# Patient Record
Sex: Male | Born: 1965 | Hispanic: Yes | Smoking: Never smoker
Health system: Southern US, Community
[De-identification: ages and names within clinical notes are randomized; demographics above are authoritative.]

## PROBLEM LIST (undated history)

## (undated) DIAGNOSIS — R7303 Prediabetes: Secondary | ICD-10-CM

---

## 2006-09-23 ENCOUNTER — Emergency Department: Payer: Self-pay | Admitting: Unknown Physician Specialty

## 2006-09-23 IMAGING — CT CT STONE STUDY
1 of 2 series · 15 of 32 positions shown, 19 images · non-contrast
Comparison: none

REASON FOR EXAM: right flank pain
COMMENTS:

[Series 2: stone · axial · 0.69mm/px · z∈[-271,+116]mm · 15 of 146 slices shown, 19 images]
[im 11/146  soft-tissue]
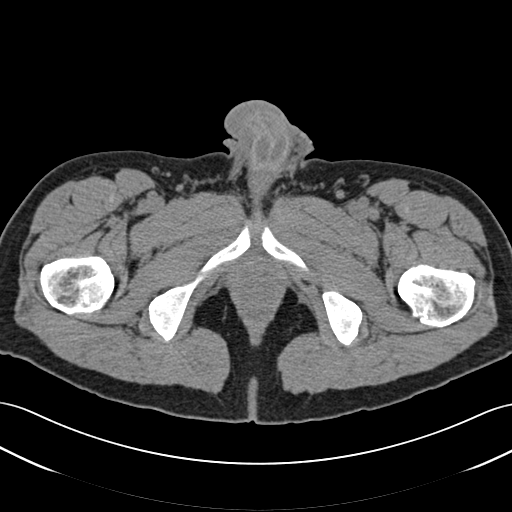
[im 11/146  bone]
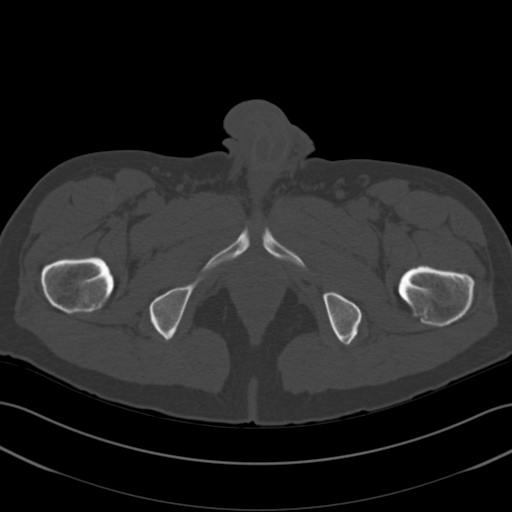
[im 21/146  soft-tissue]
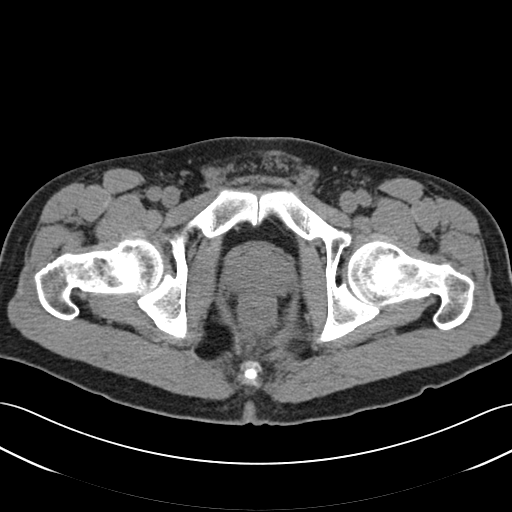
[im 32/146  soft-tissue]
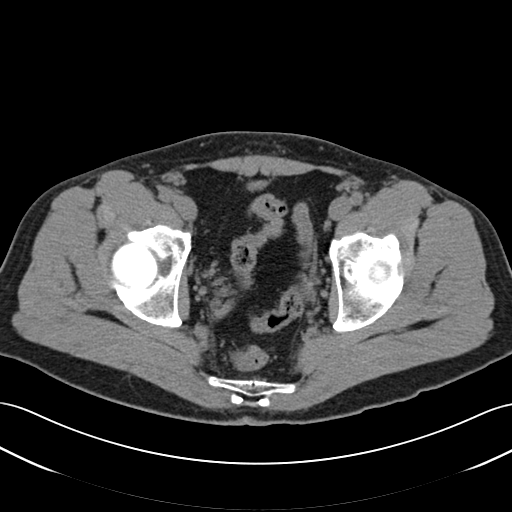
[im 42/146  soft-tissue]
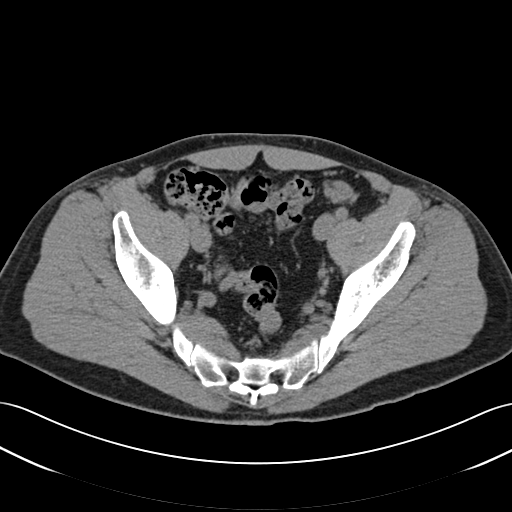
[im 52/146  soft-tissue]
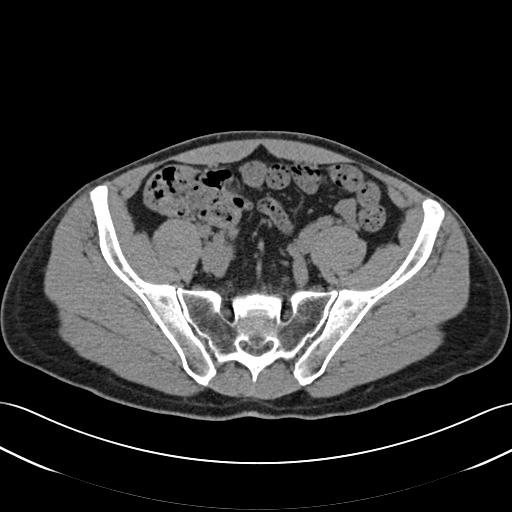
[im 63/146  soft-tissue]
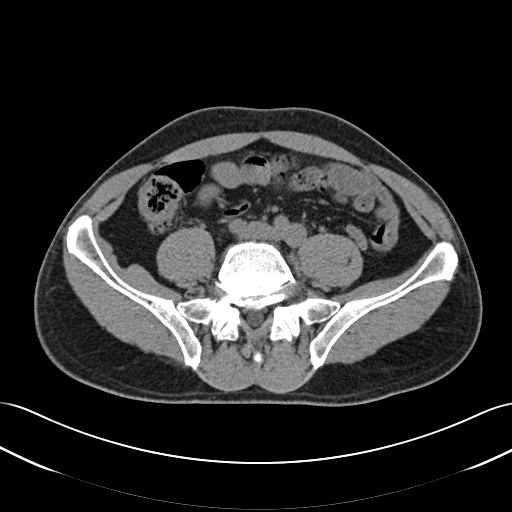
[im 73/146  soft-tissue]
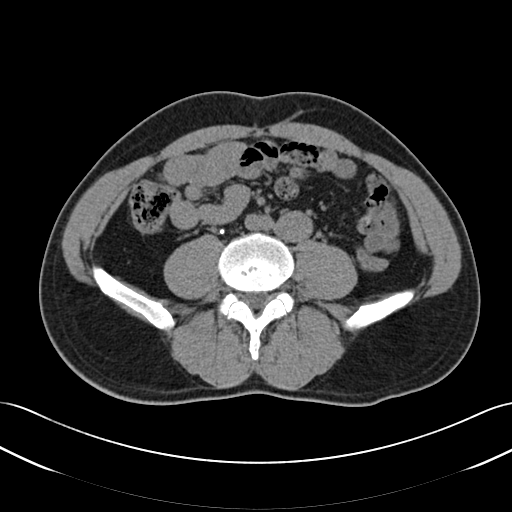
[im 83/146  soft-tissue]
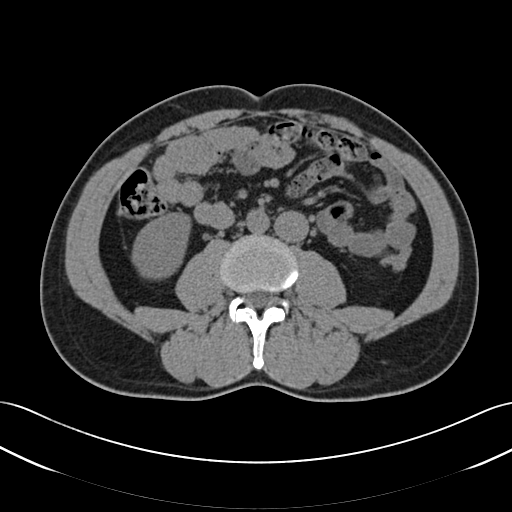
[im 94/146  soft-tissue]
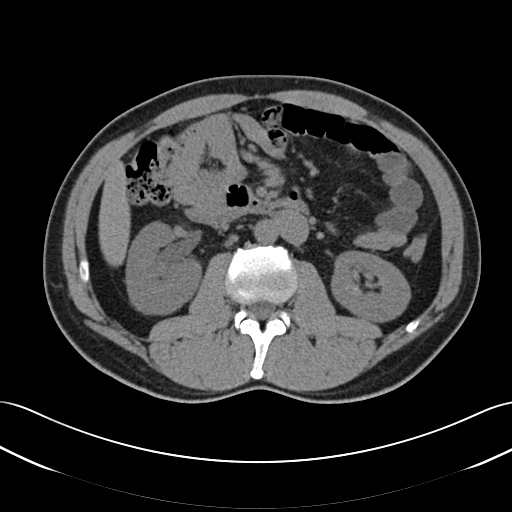
[im 94/146  bone]
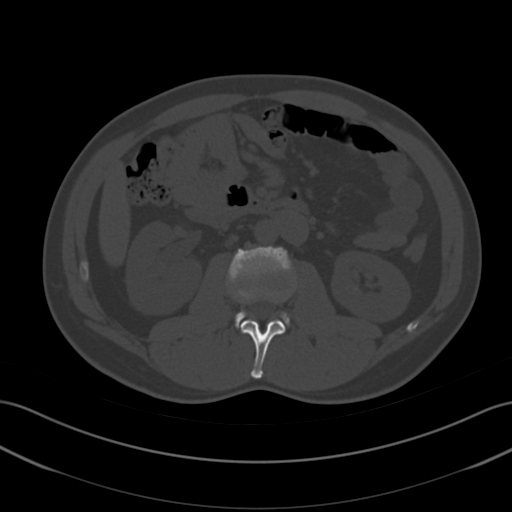
[im 104/146  soft-tissue]
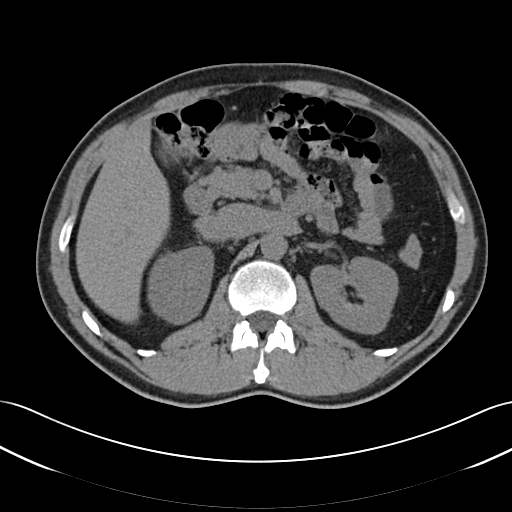
[im 114/146  soft-tissue]
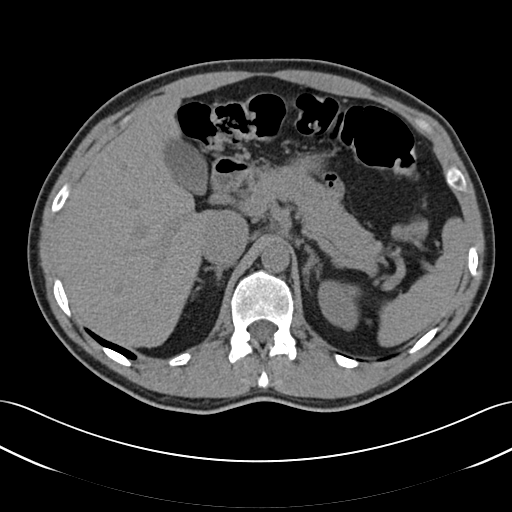
[im 125/146  soft-tissue]
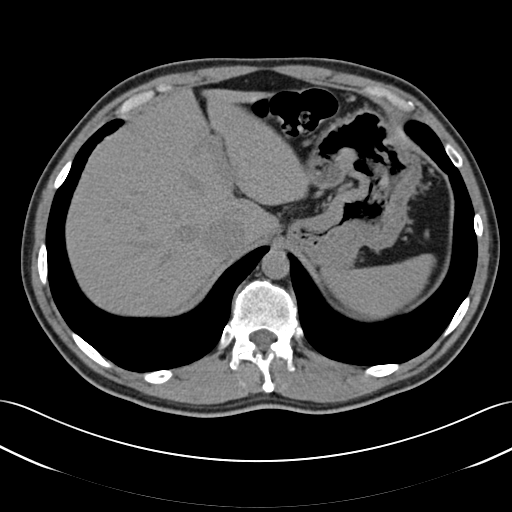
[im 125/146  lung]
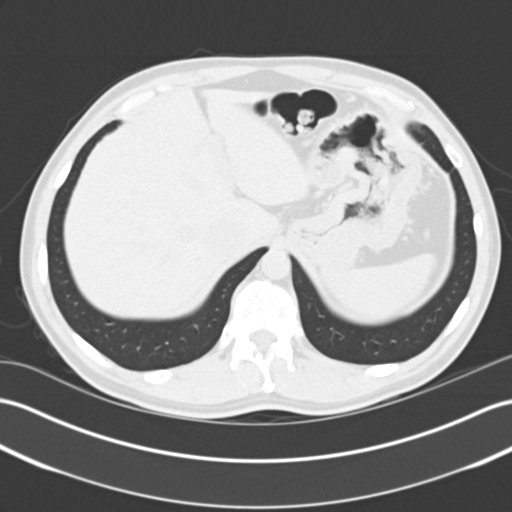
[im 130/146  lung]
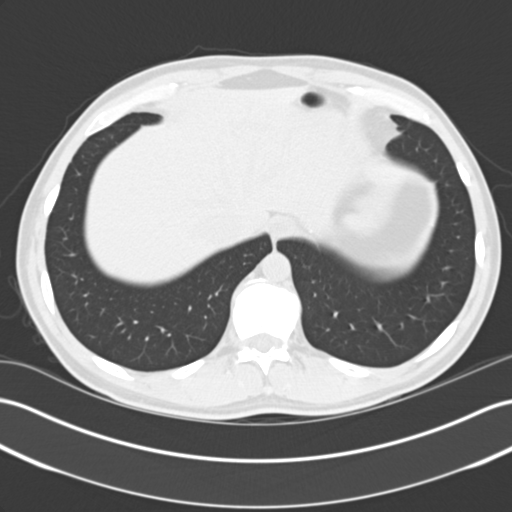
[im 135/146  soft-tissue]
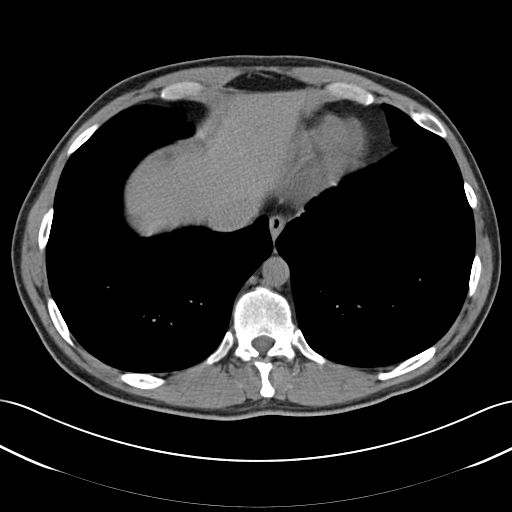
[im 135/146  lung]
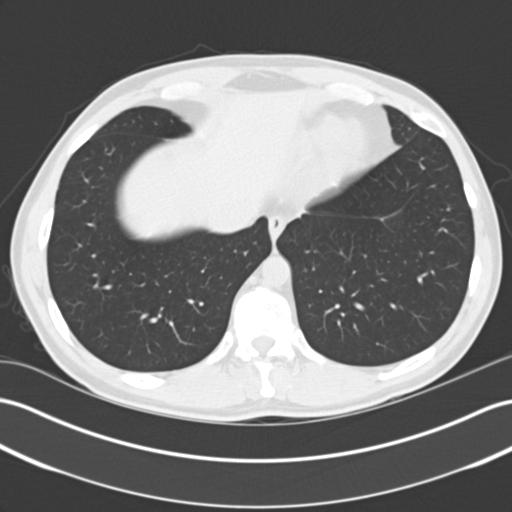
[im 140/146  lung]
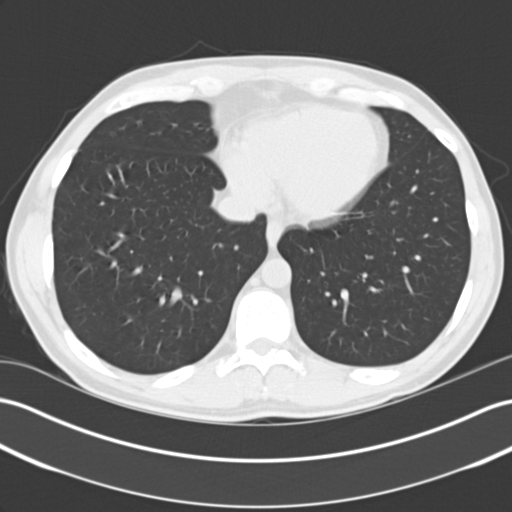

[15 of 32 positions shown; findings below may reference images not displayed]

PROCEDURE:     CT  - CT ABDOMEN /PELVIS WO (STONE)  - September 23, 2006  [DATE]

RESULT:          Noncontrast emergent CT scan of the abdomen and pelvis is
performed utilizing a renal stone protocol.

Images through the lung bases show normal aeration of the lungs.  The study
demonstrates mild RIGHT-sided hydronephrosis and hydroureter down to the
level of the iliac crest on image #74 where there is a 3 mm stone in the
proximal RIGHT ureter.  No other renal stones are seen.  No other ureteral
stones are evident.  There is no free air.  There is no free fluid.  Note is
made of an anatomical variant over the LEFT side of the inferior vena cava
below the level of the renal veins.  No acute inflammatory stranding is
seen.  Incidental note is made of a tiny calcified granuloma in the RIGHT
lower lobe on image #24.  No radiopaque gallstones are seen.  Otherwise, the
abdominal viscera appear to be grossly normal within the scope of a
noncontrast study.  Severe intervertebral joint space narrowing is seen at
L5-S1.  There may be minimal anterolisthesis of L5 on S1.  Degenerative
endplate signal changes are seen at the inferior aspect of L5 and the
superior aspect of S1.
IMPRESSION: 1.     A 3 mm RIGHT ureteral stent at the level of the iliac crest with mild
hydroureter and hydronephrosis on the RIGHT.
2.     Moderately severe degenerative changes at L5-S1.
3.     Tiny granuloma at the RIGHT lung base.

## 2014-03-13 ENCOUNTER — Ambulatory Visit: Payer: Self-pay | Admitting: Gastroenterology

## 2017-11-25 ENCOUNTER — Encounter: Payer: Self-pay | Admitting: Emergency Medicine

## 2017-11-25 ENCOUNTER — Other Ambulatory Visit: Payer: Self-pay

## 2017-11-25 ENCOUNTER — Emergency Department
Admission: EM | Admit: 2017-11-25 | Discharge: 2017-11-25 | Disposition: A | Discharge status: Home / Self Care | Payer: BLUE CROSS/BLUE SHIELD | Attending: Emergency Medicine | Admitting: Emergency Medicine

## 2017-11-25 ENCOUNTER — Emergency Department: Payer: BLUE CROSS/BLUE SHIELD

## 2017-11-25 DIAGNOSIS — E0590 Thyrotoxicosis, unspecified without thyrotoxic crisis or storm: Secondary | ICD-10-CM | POA: Diagnosis not present

## 2017-11-25 DIAGNOSIS — R002 Palpitations: Secondary | ICD-10-CM | POA: Insufficient documentation

## 2017-11-25 HISTORY — DX: Prediabetes: R73.03

## 2017-11-25 LAB — BASIC METABOLIC PANEL
ANION GAP: 10 (ref 5–15)
BUN: 22 mg/dL — AB (ref 6–20)
CHLORIDE: 103 mmol/L (ref 101–111)
CO2: 24 mmol/L (ref 22–32)
Calcium: 9.5 mg/dL (ref 8.9–10.3)
Creatinine, Ser: 0.86 mg/dL (ref 0.61–1.24)
GFR calc Af Amer: >60 mL/min (ref >60)
GLUCOSE: 152 mg/dL — AB (ref 65–99)
POTASSIUM: 3.5 mmol/L (ref 3.5–5.1)
Sodium: 137 mmol/L (ref 135–145)

## 2017-11-25 LAB — CBC
HEMATOCRIT: 44 % (ref 40.0–52.0)
HEMOGLOBIN: 14.6 g/dL (ref 13.0–18.0)
MCH: 27.6 pg (ref 26.0–34.0)
MCHC: 33.2 g/dL (ref 32.0–36.0)
MCV: 82.9 fL (ref 80.0–100.0)
Platelets: 225 10*3/uL (ref 150–440)
RBC: 5.31 MIL/uL (ref 4.40–5.90)
RDW: 15.4 % — AB (ref 11.5–14.5)
WBC: 6.9 10*3/uL (ref 3.8–10.6)

## 2017-11-25 LAB — TROPONIN I: Troponin I: <0.03 ng/mL (ref <0.03)

## 2017-11-25 LAB — T4, FREE: Free T4: 2.75 ng/dL — ABNORMAL HIGH (ref 0.61–1.12)

## 2017-11-25 LAB — TSH: TSH: <0.01 u[IU]/mL — ABNORMAL LOW (ref 0.350–4.500)

## 2017-11-25 IMAGING — CR DG CHEST 2V
2 series · 2 of 2 positions shown · non-contrast
Comparison: None.

CLINICAL DATA: Shortness of breath and palpitation today.

EXAM:
CHEST  2 VIEW

[chest pa]
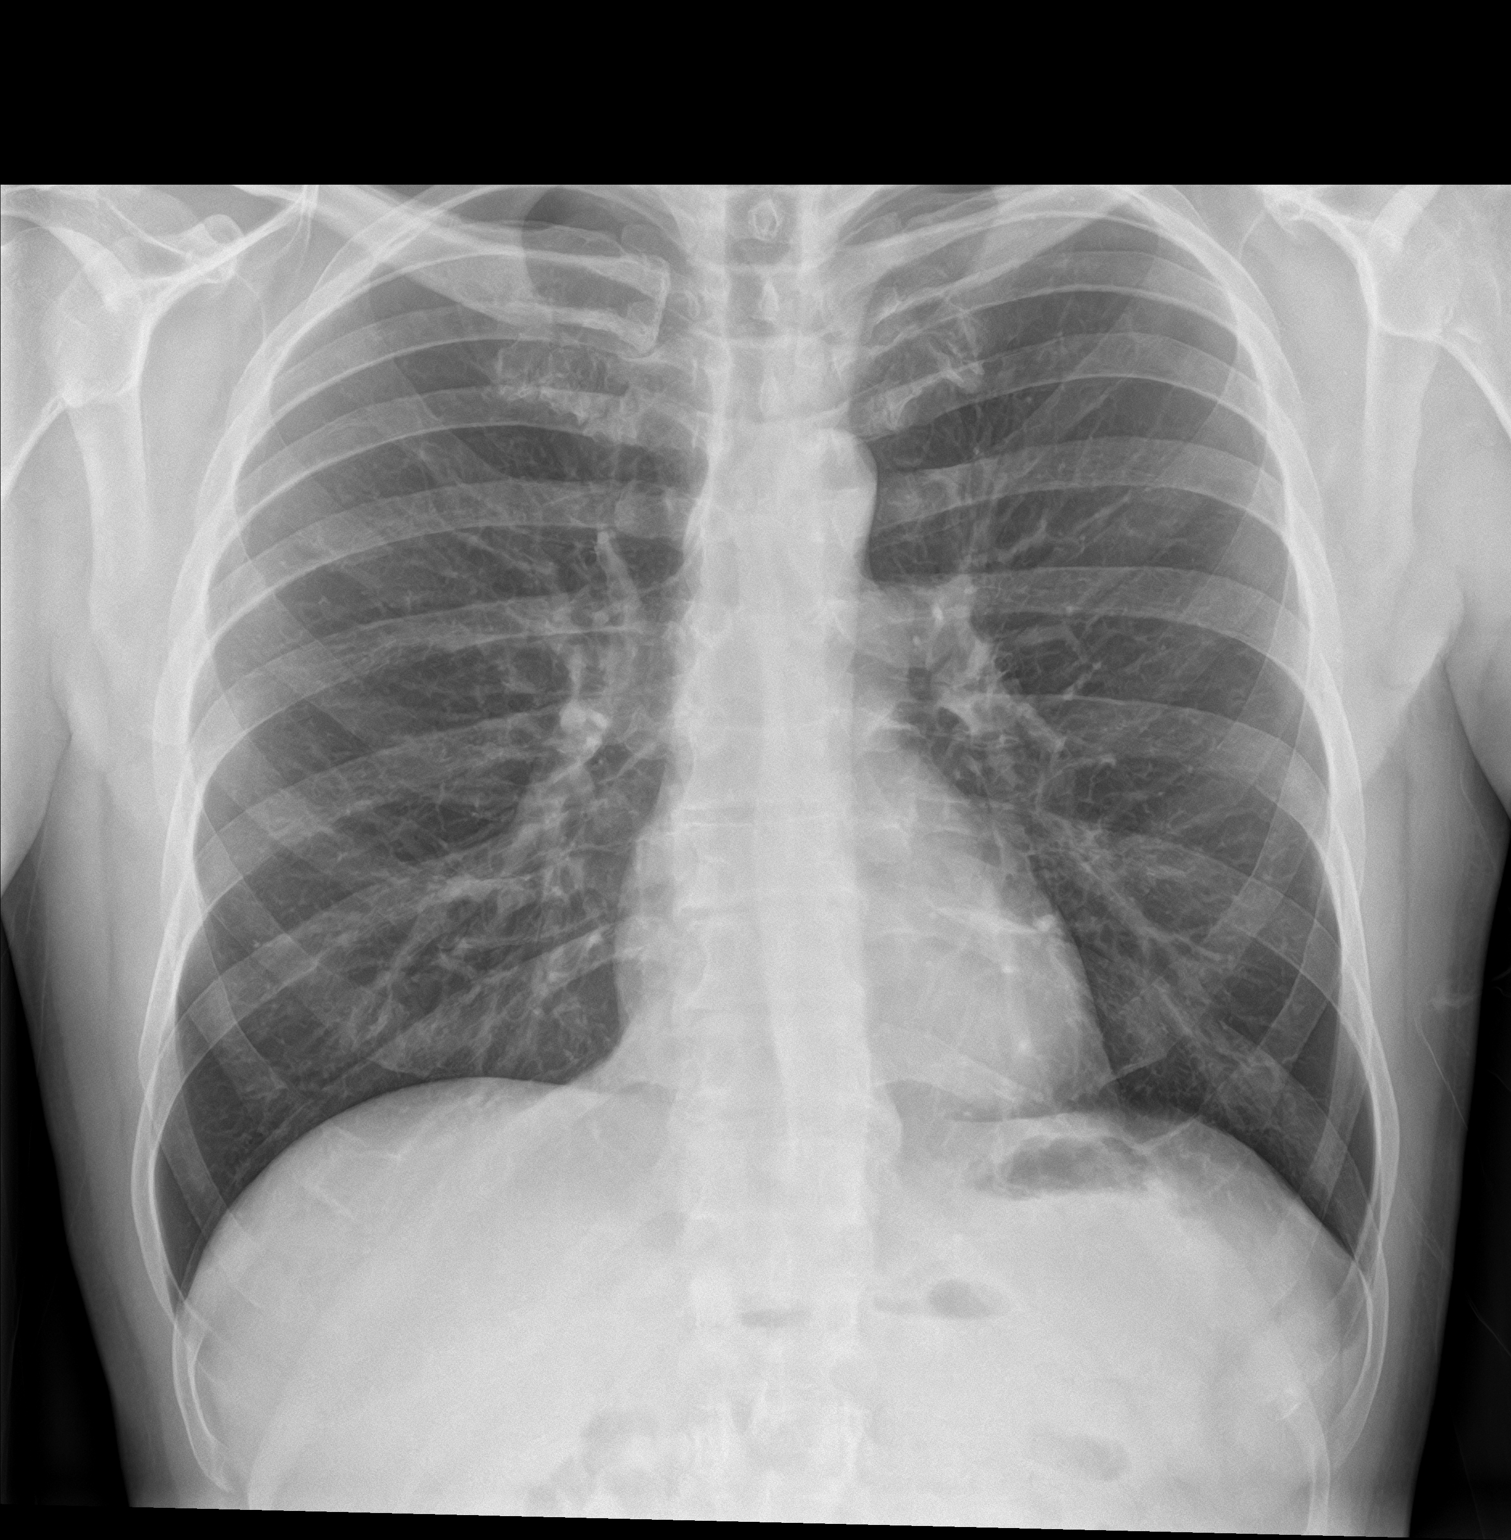

[chest lat]
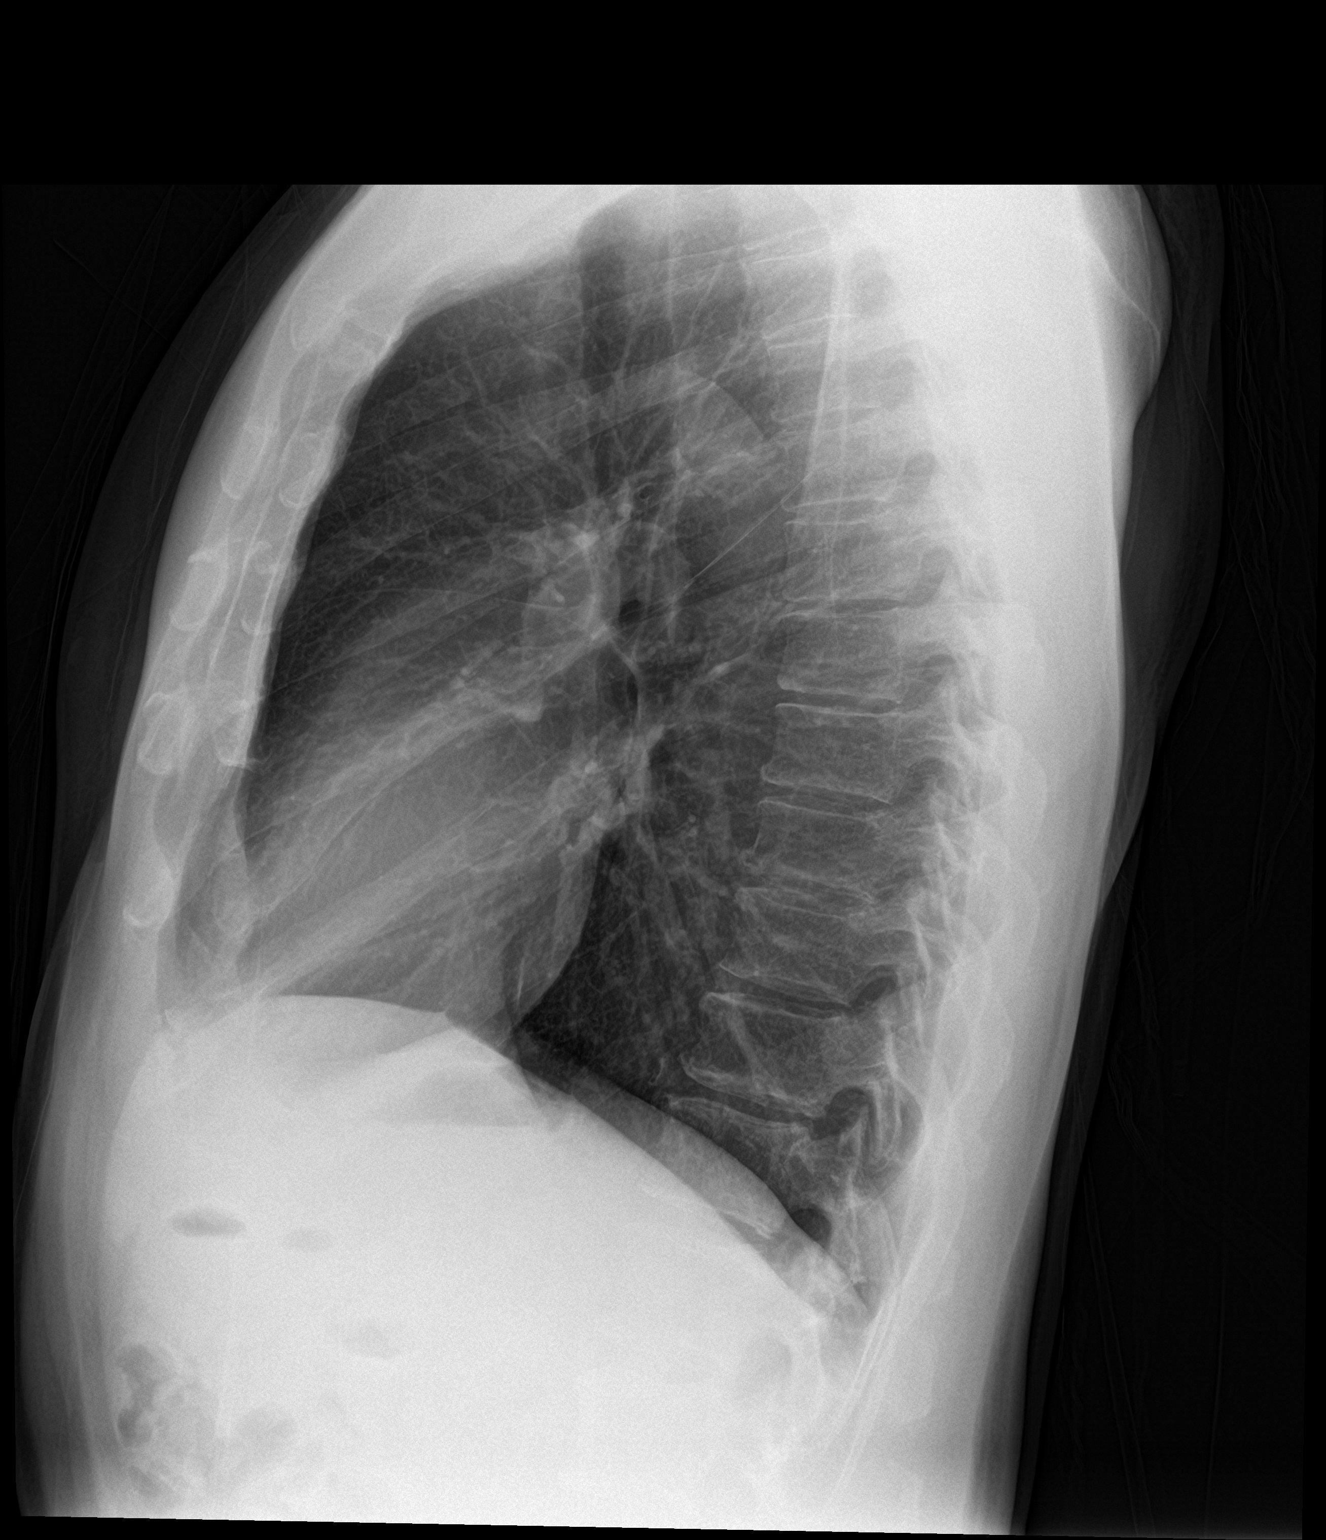

[2 of 2 positions shown; findings below may reference images not displayed]

FINDINGS: The heart size and mediastinal contours are within normal limits.
There is no focal infiltrate, pulmonary edema, or pleural effusion.
There is mild scoliosis of spine.
IMPRESSION: No active cardiopulmonary disease.

## 2017-11-25 MED ORDER — PROPRANOLOL HCL 20 MG PO TABS: 20.0000 mg | ORAL_TABLET | Freq: Two times a day (BID) | ORAL | 0 refills | Status: AC

## 2017-11-25 MED ORDER — METHIMAZOLE 10 MG PO TABS: 10.0000 mg | ORAL_TABLET | Freq: Every day | ORAL | 0 refills | Status: AC

## 2017-11-25 MED ORDER — PROPRANOLOL HCL 20 MG PO TABS
20.0000 mg | ORAL_TABLET | Freq: Once | ORAL | Status: AC
  Administered 2017-11-25: 20 mg via ORAL
  Filled 2017-11-25: qty 1

## 2017-11-25 MED ORDER — METHIMAZOLE 10 MG PO TABS
10.0000 mg | ORAL_TABLET | ORAL | Status: AC
  Administered 2017-11-25: 10 mg via ORAL
  Filled 2017-11-25: qty 1

## 2017-11-25 NOTE — ED Notes (Signed)
No distress noted, pt seated on side of bed, wife at bedside, cont to monitor

## 2017-11-25 NOTE — Discharge Instructions (Addendum)
Your labs show that you have an overactive thyroid gland which is causing your symptoms. Follow up with your primary care doctor for continued evaluation of this finding.  Take the methimazole and propranolol as prescribed to control your symptoms in the meantime.

## 2017-11-25 NOTE — ED Triage Notes (Signed)
Arrives c/o palpitations.  States feeling of heart racing started just PTA.  Patient is anxious.  No SOB/ DOE

## 2017-11-25 NOTE — ED Triage Notes (Signed)
Pt feels like heart has been "beating fast" last night.  Has had some SHOB but denies nausea, vomiting or SHOB.  Appears anxious.  When asked if has had similar in past states "when I get agitated".  Asked pt if stressed lately and he reports no but wife shakes head yes when he is not looking.  No pain or fevers.  No cough.

## 2017-11-25 NOTE — ED Provider Notes (Signed)
[facility] Emergency Department Provider Note  ____________________________________________  Time seen: Approximately 8:11 PM  I have reviewed the triage vital signs and the nursing notes.   HISTORY  Chief Complaint Palpitations    HPI Bradley Robinson is a 52 y.o. male who complains of episodes of his heart racing since last night. He is had episodes like this in the past whenever he did have exertion but since last night it seemed to happen with even minimal activity. Has palpitations and feeling like he cannot pass out. No chest pain or shortness of breath. No fevers chills sweats or weight changes. No hot or cold intolerance. No alleviating factors. Intermittent. No radiating pain. Rates it as severe.  No medical history. Takes no medicines.     Past Medical History:  Diagnosis Date  . Pre-diabetes      There are no active problems to display for this patient.    History reviewed. No pertinent surgical history.   Prior to Admission medications   Medication Sig Start Date End Date Taking? Authorizing Provider  methimazole (TAPAZOLE) 10 MG tablet Take 1 tablet (10 mg total) by mouth daily. 11/25/17   Sharman CheekStafford, Achille Xiang, MD  propranolol (INDERAL) 20 MG tablet Take 1 tablet (20 mg total) by mouth 2 (two) times daily. 11/25/17   Sharman CheekStafford, Danni Shima, MD     Allergies Patient has no known allergies.   History reviewed. No pertinent family history.  Social History Social History   Tobacco Use  . Smoking status: Never Smoker  . Smokeless tobacco: Never Used  Substance Use Topics  . Alcohol use: No    Frequency: Never  . Drug use: No    Review of Systems  Constitutional:   No fever or chills.  ENT:   No sore throat. No rhinorrhea. Cardiovascular:  Positive palpitations without syncope. Respiratory:   No dyspnea or cough. Gastrointestinal:   Negative for abdominal pain, vomiting and diarrhea.  Musculoskeletal:   Negative for focal  pain or swelling All other systems reviewed and are negative except as documented above in ROS and HPI.  ____________________________________________   PHYSICAL EXAM:  VITAL SIGNS: ED Triage Vitals  Enc Vitals Group     BP 11/25/17 1607 (!) 171/77     Pulse Rate 11/25/17 1607 (!) 115     Resp 11/25/17 1607 20     Temp 11/25/17 1607 (!) 97.5 F (36.4 C)     Temp Source 11/25/17 1607 Oral     SpO2 11/25/17 1607 100 %     Weight 11/25/17 1612 170 lb (77.1 kg)     Height 11/25/17 1612 5\' 11"  (1.803 m)     Head Circumference --      Peak Flow --      Pain Score --      Pain Loc --      Pain Edu? --      Excl. in GC? --     Vital signs reviewed, nursing assessments reviewed.   Constitutional:   Alert and oriented. Well appearing and in no distress. Eyes:   No scleral icterus.  EOMI. No nystagmus. No conjunctival pallor. PERRL. ENT   Head:   Normocephalic and atraumatic.   Nose:   No congestion/rhinnorhea.    Mouth/Throat:   MMM, no pharyngeal erythema. No peritonsillar mass.    Neck:   No meningismus. Full ROM. Thyroid nonpalpable Hematological/Lymphatic/Immunilogical:   No cervical lymphadenopathy. Cardiovascular:   RRR, rate of 80. Symmetric bilateral radial and DP  pulses.  No murmurs.  Respiratory:   Normal respiratory effort without tachypnea/retractions. Breath sounds are clear and equal bilaterally. No wheezes/rales/rhonchi. Gastrointestinal:   Soft and nontender. Non distended. There is no CVA tenderness.  No rebound, rigidity, or guarding. Genitourinary:   deferred Musculoskeletal:   Normal range of motion in all extremities. No joint effusions.  No lower extremity tenderness.  No edema. Neurologic:   Normal speech and language.  Motor grossly intact. No gross focal neurologic deficits are appreciated.  Skin:    Skin is warm, dry and intact. No rash noted.  No petechiae, purpura, or bullae.  ____________________________________________    LABS  (pertinent positives/negatives) (all labs ordered are listed, but only abnormal results are displayed) Labs Reviewed  BASIC METABOLIC PANEL - Abnormal; Notable for the following components:      Result Value   Glucose, Bld 152 (*)    BUN 22 (*)    All other components within normal limits  CBC - Abnormal; Notable for the following components:   RDW 15.4 (*)    All other components within normal limits  TSH - Abnormal; Notable for the following components:   TSH <0.010 (*)    All other components within normal limits  T4, FREE - Abnormal; Notable for the following components:   Free T4 2.75 (*)    All other components within normal limits  TROPONIN I  TROPONIN I  TROPONIN I   ____________________________________________   EKG  Interpreted by me Sinus tachycardia rate 106, normal axis and intervals. Incomplete right bundle-branch block. No acute ischemic changes. Normal ST segments and T waves.  ____________________________________________    RADIOLOGY  Dg Chest 2 View  Result Date: 11/25/2017 CLINICAL DATA:  Shortness of breath and palpitation today. EXAM: CHEST  2 VIEW COMPARISON:  None. FINDINGS: The heart size and mediastinal contours are within normal limits. There is no focal infiltrate, pulmonary edema, or pleural effusion. There is mild scoliosis of spine. IMPRESSION: No active cardiopulmonary disease. Electronically Signed   By: Sherian Rein M.D.   On: 11/25/2017 17:22    ____________________________________________   PROCEDURES Procedures  ____________________________________________    CLINICAL IMPRESSION / ASSESSMENT AND PLAN / ED COURSE  Pertinent labs & imaging results that were available during my care of the patient were reviewed by me and considered in my medical decision making (see chart for details).     Clinical Course as of Nov 25 2009  Wynelle Link Nov 25, 2017  1829 Patient well appearing no acute distress, heart rate about 80 on my exam.  Initial labs unremarkable. We'll check thyroid studies, repeat troponin. Plan to start low-dose beta blocker for symptomatic relief and have him follow up with cardiology for potential Holter monitoring if there are no severe findings today. Overall exam is reassuring and I think he'll be suitable for outpatient follow-up with reassuring labs today.  [PS]  1844 Thyroid studies indicate primary hyperthyroidism. Will start beta blocker and refer to outpatient followup for further workup and management.  [PS]    Clinical Course User Index [PS] Sharman Cheek, MD     ____________________________________________   FINAL CLINICAL IMPRESSION(S) / ED DIAGNOSES    Final diagnoses:  Palpitations  Hyperthyroidism       Portions of this note were generated with dragon dictation software. Dictation errors may occur despite best attempts at proofreading.    Sharman Cheek, MD 11/25/17 2016

## 2017-11-25 NOTE — ED Notes (Signed)
Pt back from x-ray.

## 2017-11-25 NOTE — ED Notes (Signed)
Patient transported to X-ray 

## 2017-11-25 NOTE — ED Notes (Signed)

## 2017-11-29 ENCOUNTER — Other Ambulatory Visit: Payer: Self-pay | Admitting: Family Medicine

## 2017-11-29 DIAGNOSIS — E0590 Thyrotoxicosis, unspecified without thyrotoxic crisis or storm: Secondary | ICD-10-CM

## 2017-12-20 ENCOUNTER — Encounter: Admission: RE | Admit: 2017-12-20 | Payer: BLUE CROSS/BLUE SHIELD | Source: Ambulatory Visit

## 2017-12-20 ENCOUNTER — Encounter
Admission: RE | Admit: 2017-12-20 | Discharge: 2017-12-20 | Disposition: A | Discharge status: Home / Self Care | Payer: BLUE CROSS/BLUE SHIELD | Source: Ambulatory Visit | Attending: Family Medicine | Admitting: Family Medicine

## 2017-12-20 ENCOUNTER — Ambulatory Visit
Admission: RE | Admit: 2017-12-20 | Discharge: 2017-12-20 | Disposition: A | Discharge status: Home / Self Care | Payer: BLUE CROSS/BLUE SHIELD | Source: Ambulatory Visit | Attending: Family Medicine | Admitting: Family Medicine

## 2017-12-20 DIAGNOSIS — E0590 Thyrotoxicosis, unspecified without thyrotoxic crisis or storm: Secondary | ICD-10-CM

## 2017-12-20 IMAGING — US US THYROID
1 series · 14 of 25 positions shown · non-contrast
Comparison: None.

CLINICAL DATA: Hypothyroidism.

EXAM:
THYROID ULTRASOUND
TECHNIQUE: Ultrasound examination of the thyroid gland and adjacent soft
tissues was performed.

[Series 1: us thyroid · 0.06mm/px · 14 of 54 slices shown]
[im 1/54]
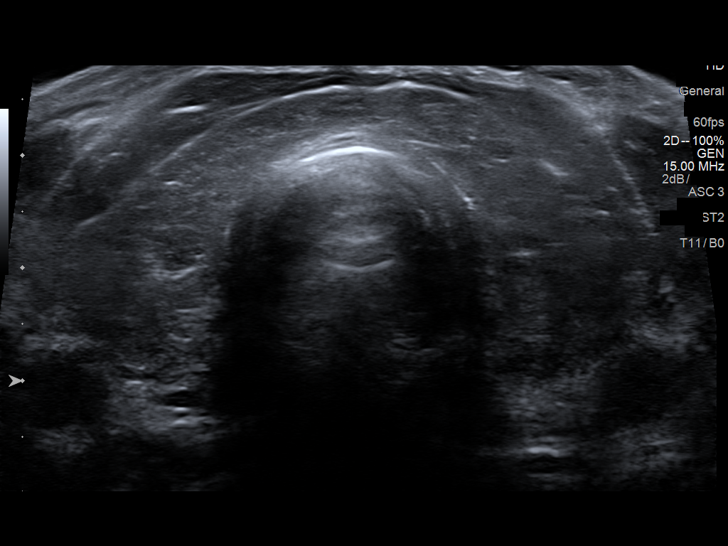
[im 5/54]
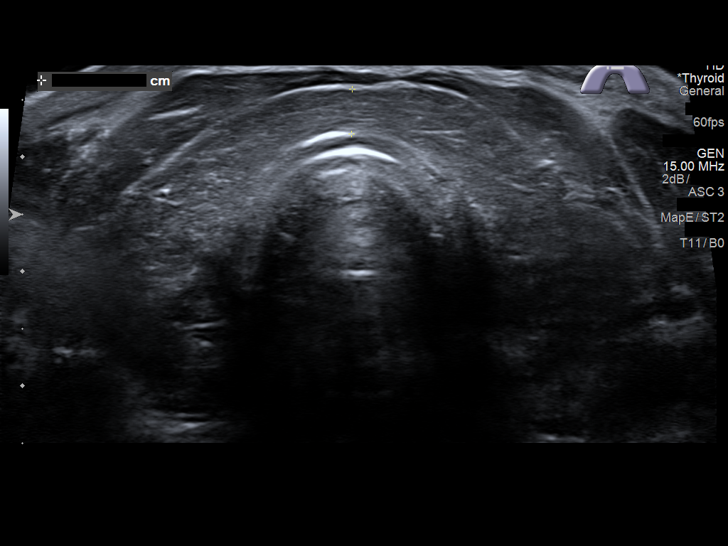
[im 9/54]
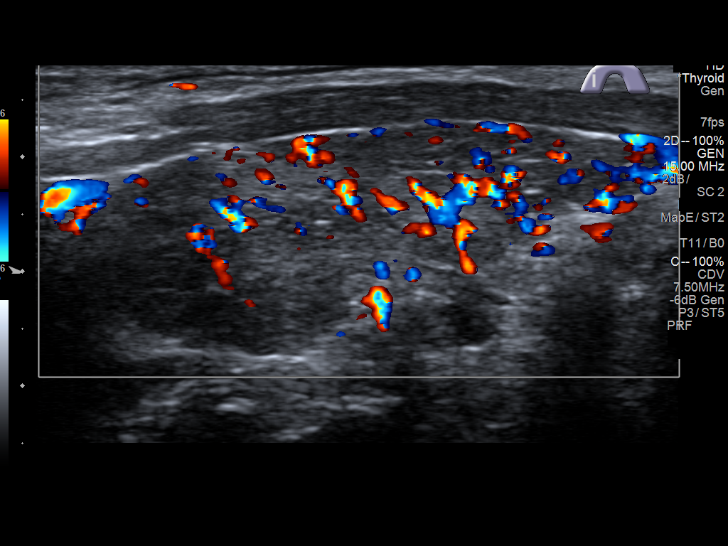
[im 14/54]
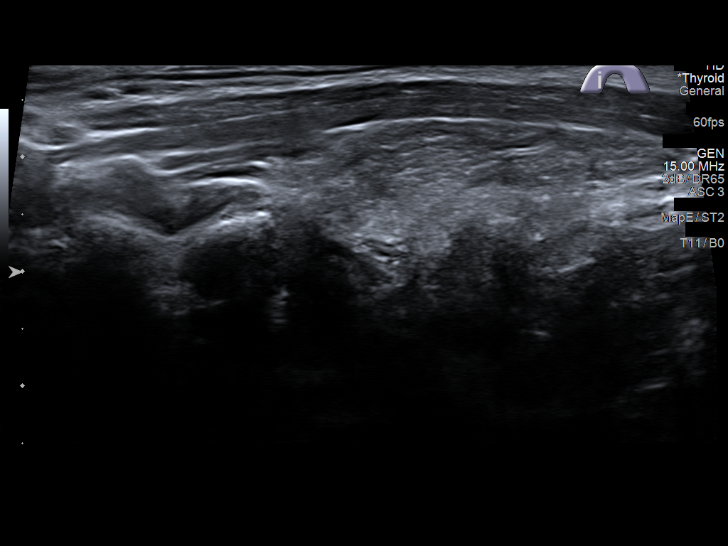
[im 18/54]
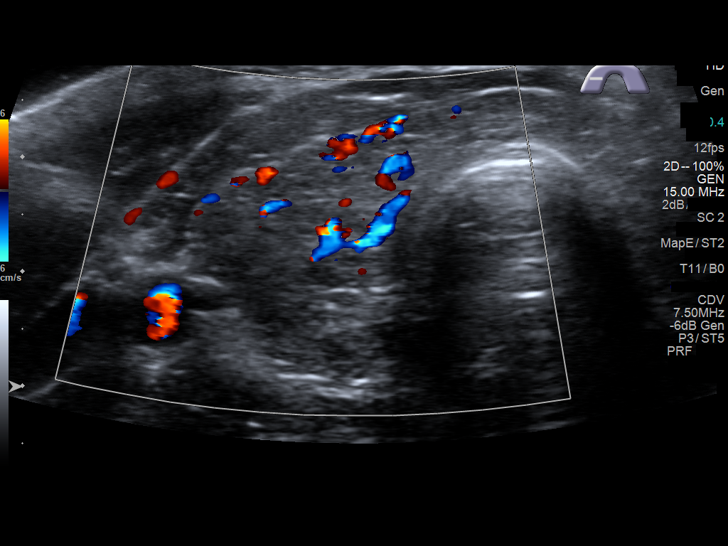
[im 20/54]
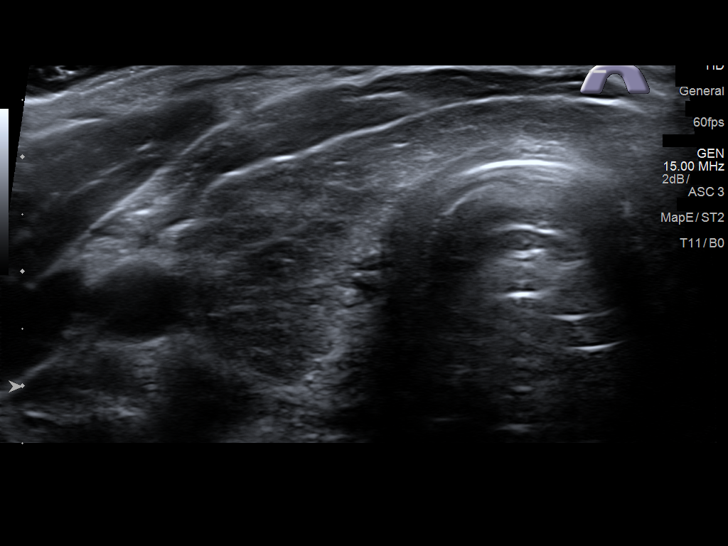
[im 25/54]
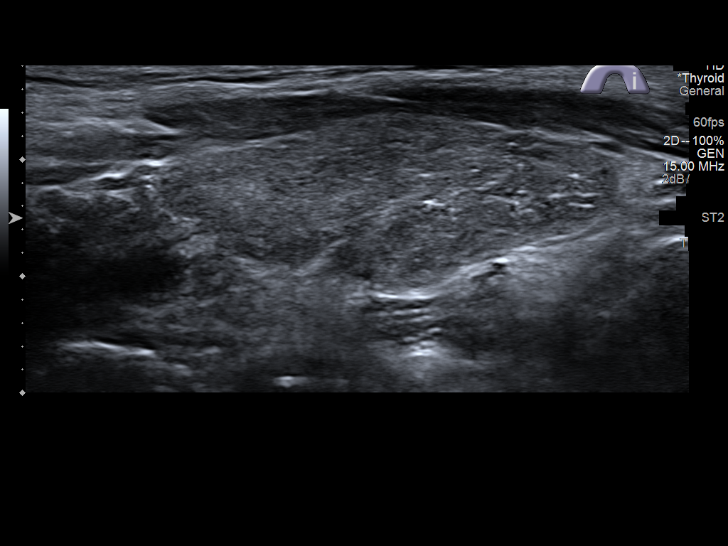
[im 29/54]
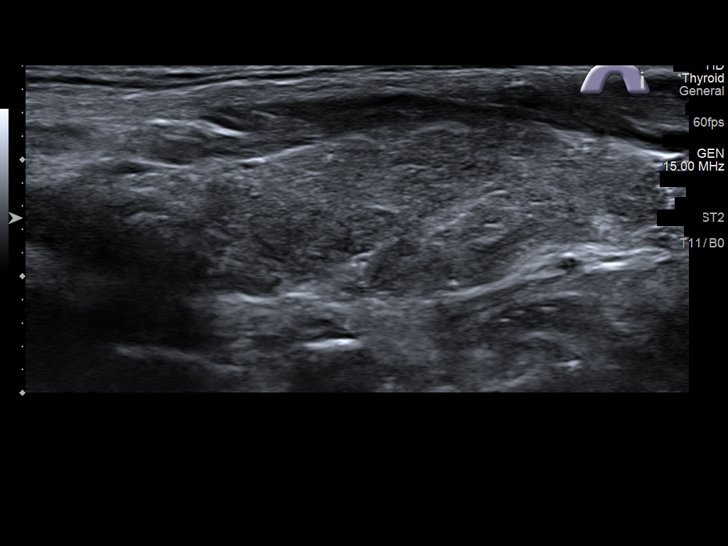
[im 34/54]
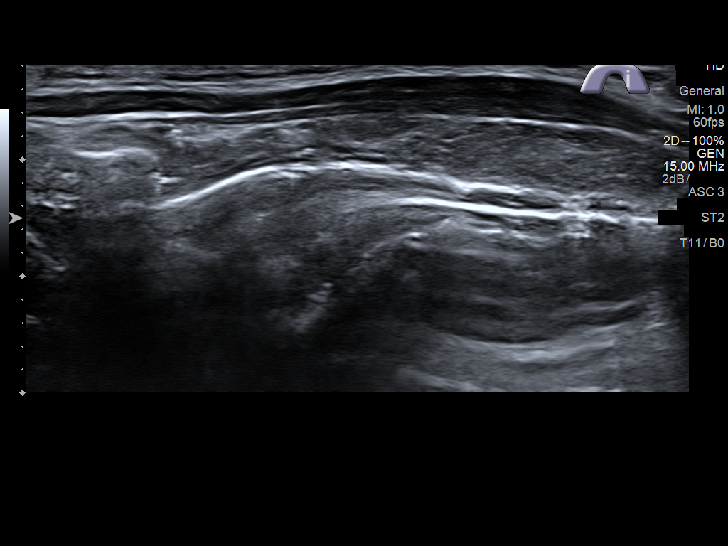
[im 36/54]
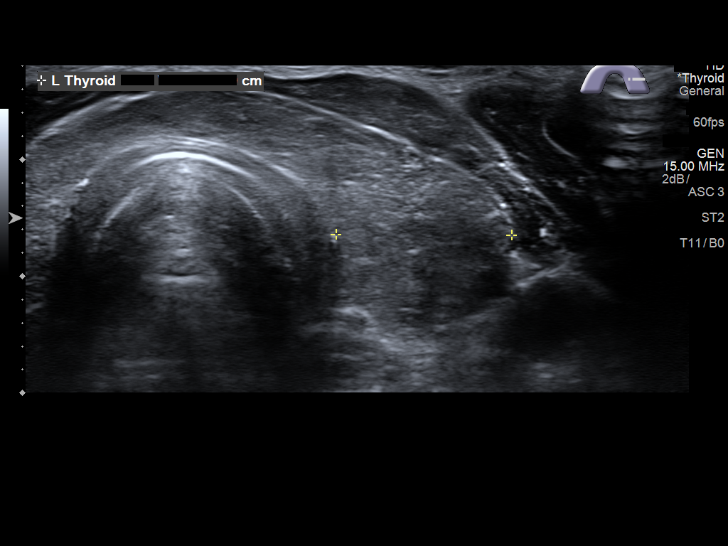
[im 40/54]
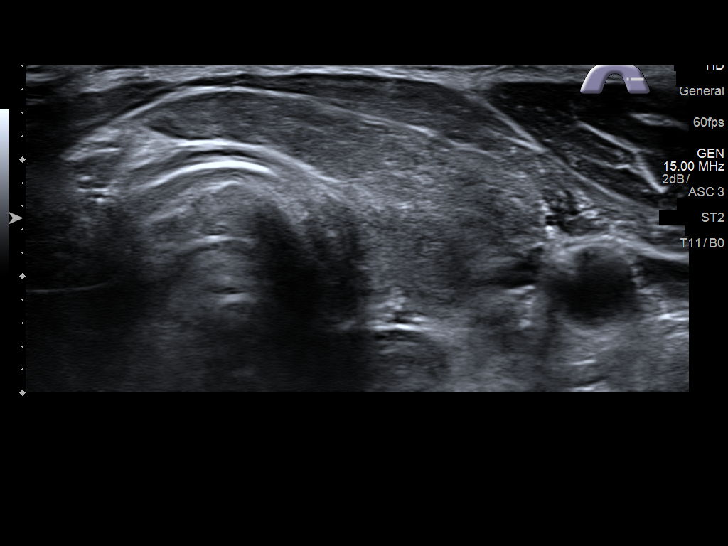
[im 45/54]
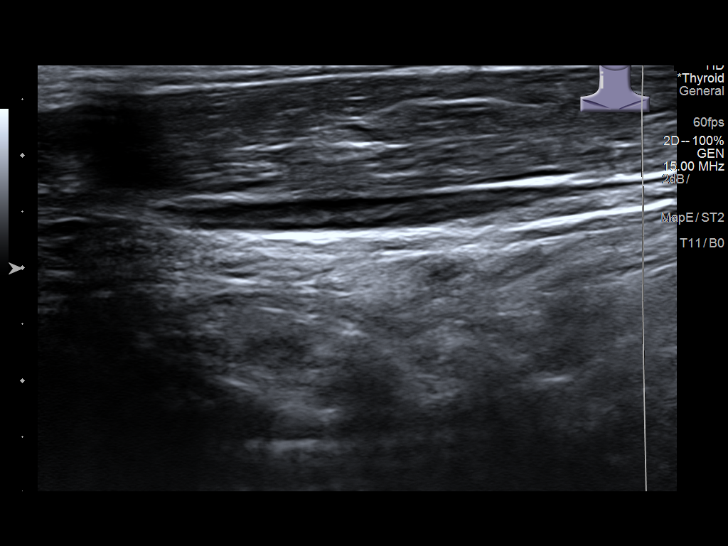
[im 49/54]
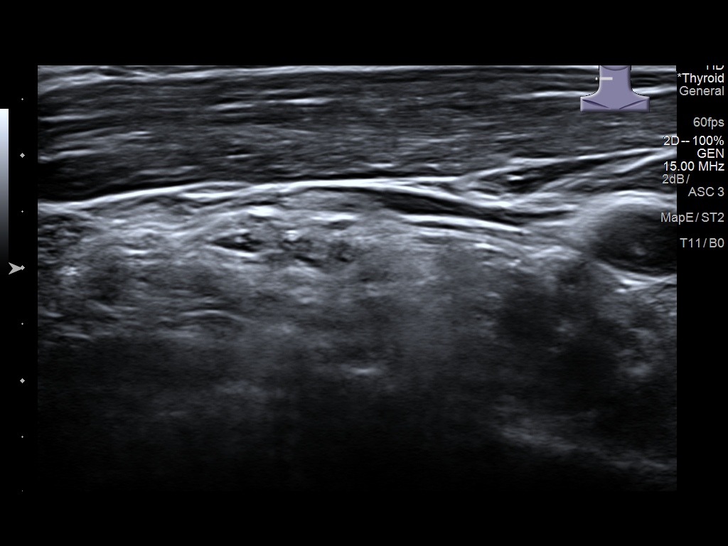
[im 54/54]
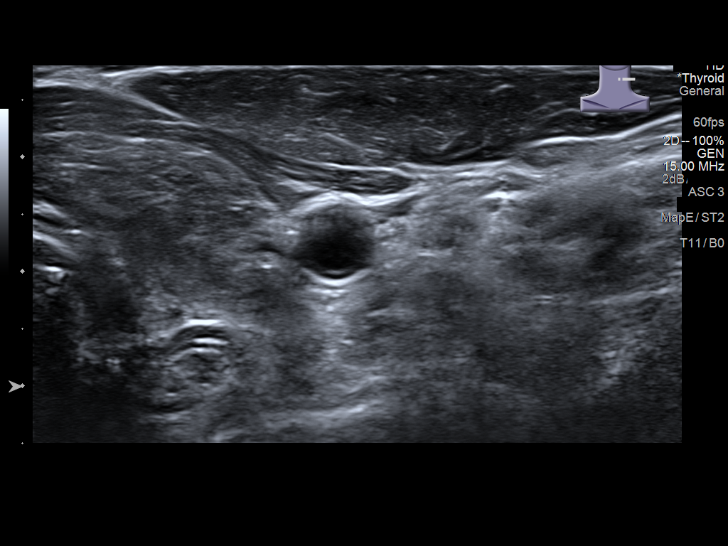

[14 of 25 positions shown; findings below may reference images not displayed]

FINDINGS: Parenchymal Echotexture: Moderately heterogenous

Isthmus: 0.4 cm

Right lobe: 5.3 x 1.8 x 1.7 cm

Left lobe: 4.0 x 1.5 x 1.5 cm

_________________________________________________________

Estimated total number of nodules >/= 1 cm: 0

Number of spongiform nodules >/=  2 cm not described below (TR1): 0

Number of mixed cystic and solid nodules >/= 1.5 cm not described
below (TR2): 0

_________________________________________________________

No discrete nodules are seen within the thyroid gland. Thyroid
tissue is slightly hypervascular.
IMPRESSION: Thyroid tissue is heterogeneous but no discrete nodules.

## 2017-12-21 ENCOUNTER — Ambulatory Visit: Payer: BLUE CROSS/BLUE SHIELD

## 2017-12-24 ENCOUNTER — Encounter: Payer: Self-pay | Admitting: Medical Oncology

## 2017-12-24 ENCOUNTER — Emergency Department
Admission: EM | Admit: 2017-12-24 | Discharge: 2017-12-24 | Disposition: A | Discharge status: Home / Self Care | Payer: BLUE CROSS/BLUE SHIELD | Attending: Emergency Medicine | Admitting: Emergency Medicine

## 2017-12-24 ENCOUNTER — Emergency Department: Payer: BLUE CROSS/BLUE SHIELD

## 2017-12-24 DIAGNOSIS — R000 Tachycardia, unspecified: Secondary | ICD-10-CM | POA: Diagnosis present

## 2017-12-24 DIAGNOSIS — Z79899 Other long term (current) drug therapy: Secondary | ICD-10-CM | POA: Diagnosis not present

## 2017-12-24 DIAGNOSIS — E0590 Thyrotoxicosis, unspecified without thyrotoxic crisis or storm: Secondary | ICD-10-CM | POA: Diagnosis not present

## 2017-12-24 DIAGNOSIS — R0981 Nasal congestion: Secondary | ICD-10-CM | POA: Diagnosis not present

## 2017-12-24 LAB — BASIC METABOLIC PANEL
Anion gap: 10 (ref 5–15)
BUN: 17 mg/dL (ref 6–20)
CHLORIDE: 106 mmol/L (ref 101–111)
CO2: 20 mmol/L — ABNORMAL LOW (ref 22–32)
Calcium: 9.6 mg/dL (ref 8.9–10.3)
Creatinine, Ser: 0.76 mg/dL (ref 0.61–1.24)
GFR calc non Af Amer: >60 mL/min (ref >60)
GLUCOSE: 140 mg/dL — AB (ref 65–99)
POTASSIUM: 3.6 mmol/L (ref 3.5–5.1)
Sodium: 136 mmol/L (ref 135–145)

## 2017-12-24 LAB — CBC
HEMATOCRIT: 45.9 % (ref 40.0–52.0)
Hemoglobin: 15.4 g/dL (ref 13.0–18.0)
MCH: 27.8 pg (ref 26.0–34.0)
MCHC: 33.5 g/dL (ref 32.0–36.0)
MCV: 83 fL (ref 80.0–100.0)
Platelets: 209 10*3/uL (ref 150–440)
RBC: 5.53 MIL/uL (ref 4.40–5.90)
RDW: 15.9 % — ABNORMAL HIGH (ref 11.5–14.5)
WBC: 9.8 10*3/uL (ref 3.8–10.6)

## 2017-12-24 LAB — TROPONIN I: Troponin I: <0.03 ng/mL (ref <0.03)

## 2017-12-24 LAB — T4, FREE: FREE T4: 2.75 ng/dL — AB (ref 0.61–1.12)

## 2017-12-24 LAB — TSH: TSH: <0.01 u[IU]/mL — ABNORMAL LOW (ref 0.350–4.500)

## 2017-12-24 IMAGING — CR DG CHEST 2V
1 series · 2 of 2 positions shown · non-contrast
Comparison: Chest x-ray 11/25/2017.

CLINICAL DATA: 51-year-old male with history of racing heart beat.
Shortness of breath.

EXAM:
CHEST  2 VIEW

[Series 1: dg chest 2 view · 0.14mm/px · 2 of 2 slices shown]
[im 1/2]
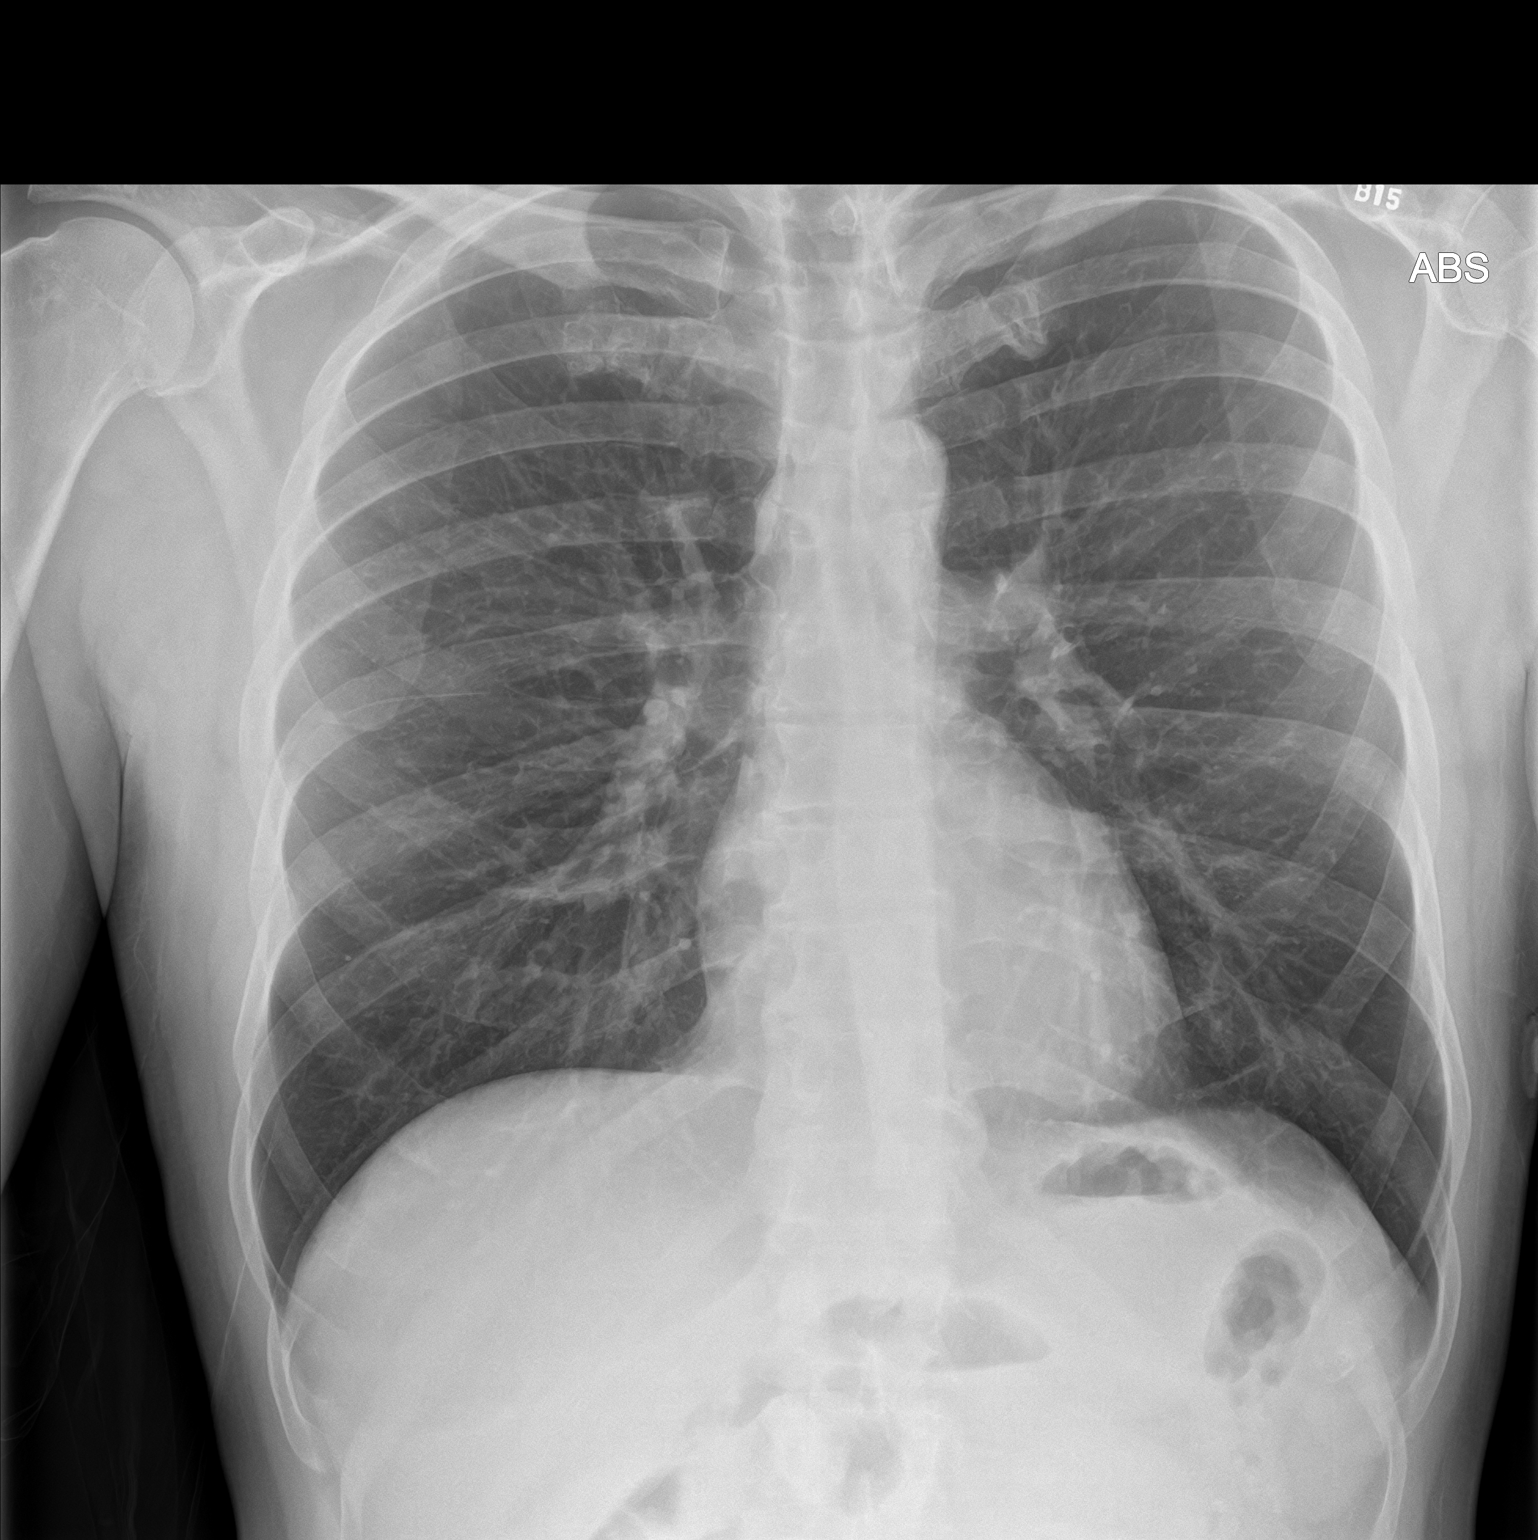
[im 2/2]
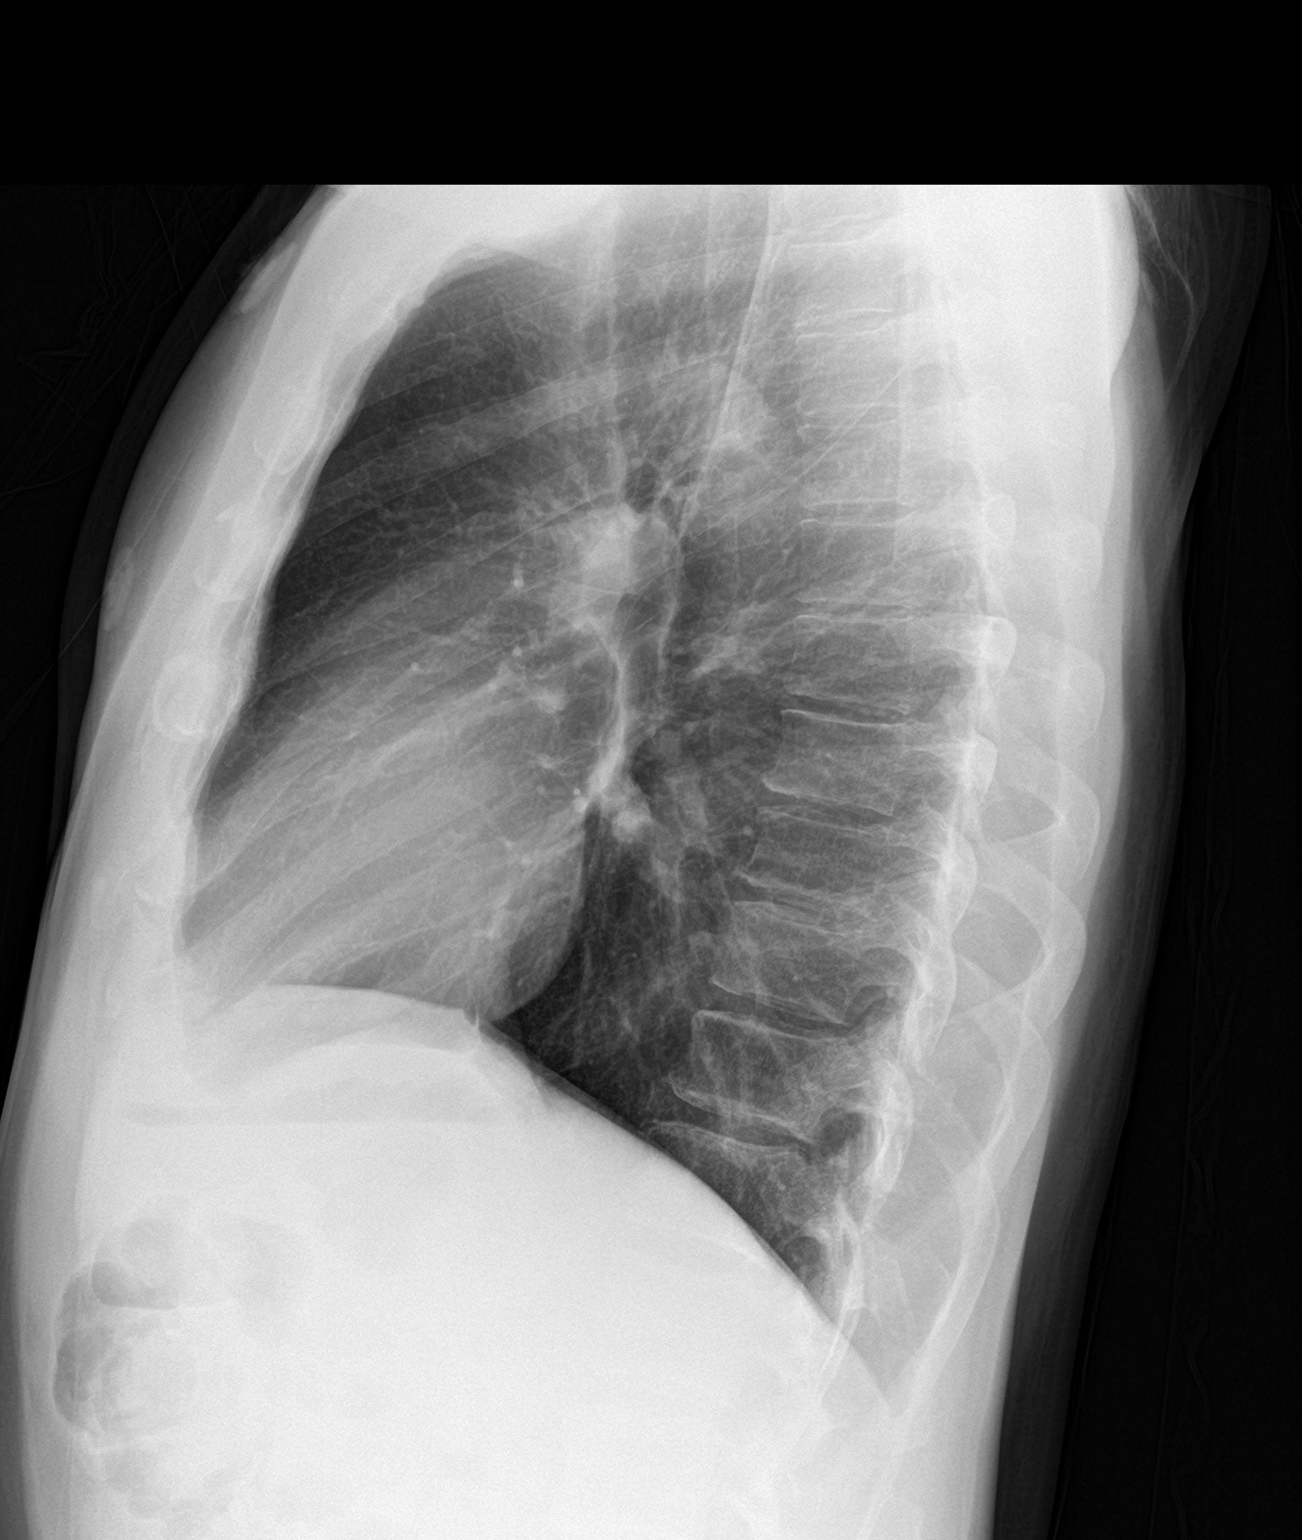

[2 of 2 positions shown; findings below may reference images not displayed]

FINDINGS: Lung volumes are normal. No consolidative airspace disease. No
pleural effusions. No pneumothorax. No pulmonary nodule or mass
noted. Pulmonary vasculature and the cardiomediastinal silhouette
are within normal limits.
IMPRESSION: No radiographic evidence of acute cardiopulmonary disease.

## 2017-12-24 MED ORDER — IBUPROFEN 600 MG PO TABS: 600.0000 mg | ORAL_TABLET | Freq: Three times a day (TID) | ORAL | 0 refills | Status: AC | PRN

## 2017-12-24 MED ORDER — OXYMETAZOLINE HCL 0.05 % NA SOLN
1.0000 | Freq: Once | NASAL | Status: AC
  Administered 2017-12-24: 1 via NASAL
  Filled 2017-12-24: qty 15

## 2017-12-24 MED ORDER — IPRATROPIUM BROMIDE 0.06 % NA SOLN: 2.0000 | Freq: Three times a day (TID) | NASAL | 0 refills | Status: AC

## 2017-12-24 MED ORDER — IBUPROFEN 600 MG PO TABS
600.0000 mg | ORAL_TABLET | Freq: Once | ORAL | Status: AC
  Administered 2017-12-24: 600 mg via ORAL
  Filled 2017-12-24: qty 1

## 2017-12-24 NOTE — Discharge Instructions (Signed)
Please take your pain medication as needed for severe symptoms and follow-up with your primary care physician regarding her thyroid this coming Thursday as scheduled.  Return to the emergency department sooner for any concerns whatsoever.  It was a pleasure to take care of you today, and thank you for coming to our emergency department.  If you have any questions or concerns before leaving please ask the nurse to grab me and I'm more than happy to go through your aftercare instructions again.  If you were prescribed any opioid pain medication today such as Norco, Vicodin, Percocet, morphine, hydrocodone, or oxycodone please make sure you do not drive when you are taking this medication as it can alter your ability to drive safely.  If you have any concerns once you are home that you are not improving or are in fact getting worse before you can make it to your follow-up appointment, please do not hesitate to call [phone] and come back for further evaluation.  Merrily BrittleNeil Skye Plamondon, MD  Results for orders placed or performed during the hospital encounter of 12/24/17  Basic metabolic panel  Result Value Ref Range   Sodium 136 135 - 145 mmol/L   Potassium 3.6 3.5 - 5.1 mmol/L   Chloride 106 101 - 111 mmol/L   CO2 20 (L) 22 - 32 mmol/L   Glucose, Bld 140 (H) 65 - 99 mg/dL   BUN 17 6 - 20 mg/dL   Creatinine, Ser 1.610.76 0.61 - 1.24 mg/dL   Calcium 9.6 8.9 - 09.610.3 mg/dL   GFR calc non Af Amer >60 >60 mL/min   GFR calc Af Amer >60 >60 mL/min   Anion gap 10 5 - 15  CBC  Result Value Ref Range   WBC 9.8 3.8 - 10.6 K/uL   RBC 5.53 4.40 - 5.90 MIL/uL   Hemoglobin 15.4 13.0 - 18.0 g/dL   HCT 04.545.9 40.940.0 - 81.152.0 %   MCV 83.0 80.0 - 100.0 fL   MCH 27.8 26.0 - 34.0 pg   MCHC 33.5 32.0 - 36.0 g/dL   RDW 91.415.9 (H) 78.211.5 - 95.614.5 %   Platelets 209 150 - 440 K/uL  Troponin I  Result Value Ref Range   Troponin I <0.03 <0.03 ng/mL   Dg Chest 2 View  Result Date: 12/24/2017 CLINICAL DATA:  52 year old male with history  of racing heart beat. Shortness of breath. EXAM: CHEST  2 VIEW COMPARISON:  Chest x-ray 11/25/2017. FINDINGS: Lung volumes are normal. No consolidative airspace disease. No pleural effusions. No pneumothorax. No pulmonary nodule or mass noted. Pulmonary vasculature and the cardiomediastinal silhouette are within normal limits. IMPRESSION: No radiographic evidence of acute cardiopulmonary disease. Electronically Signed   By: Trudie Reedaniel  Entrikin M.D.   On: 12/24/2017 12:58   Dg Chest 2 View  Result Date: 11/25/2017 CLINICAL DATA:  Shortness of breath and palpitation today. EXAM: CHEST  2 VIEW COMPARISON:  None. FINDINGS: The heart size and mediastinal contours are within normal limits. There is no focal infiltrate, pulmonary edema, or pleural effusion. There is mild scoliosis of spine. IMPRESSION: No active cardiopulmonary disease. Electronically Signed   By: Sherian ReinWei-Chen  Lin M.D.   On: 11/25/2017 17:22   [place] Thyroid  Result Date: 12/20/2017 CLINICAL DATA:  Hypothyroidism. EXAM: THYROID ULTRASOUND TECHNIQUE: Ultrasound examination of the thyroid gland and adjacent soft tissues was performed. COMPARISON:  None. FINDINGS: Parenchymal Echotexture: Moderately heterogenous Isthmus: 0.4 cm Right lobe: 5.3 x 1.8 x 1.7 cm Left lobe: 4.0 x 1.5 x 1.5 cm  _________________________________________________________ Estimated total number of nodules >/= 1 cm: 0 Number of spongiform nodules >/=  2 cm not described below (TR1): 0 Number of mixed cystic and solid nodules >/= 1.5 cm not described below (TR2): 0 _________________________________________________________ No discrete nodules are seen within the thyroid gland. Thyroid tissue is slightly hypervascular. IMPRESSION: Thyroid tissue is heterogeneous but no discrete nodules. Electronically Signed   By: Richarda Overlie M.D.   On: 12/20/2017 14:09

## 2017-12-24 NOTE — ED Triage Notes (Signed)
Pt reports he feels like his heart is racing. Pt reports some SOB also. Denies NVD. Appears anxious in triage.

## 2017-12-24 NOTE — ED Provider Notes (Signed)
[facility] Emergency Department Provider Note  ____________________________________________   First MD Initiated Contact with Patient 12/24/17 1234     (approximate)  I have reviewed the triage vital signs and the nursing notes.   HISTORY  Chief Complaint Tachycardia   HPI Bradley Robinson is a 52 y.o. male who comes to the emergency department with several days of sinus congestion and tachycardia.  3 weeks ago he was seen in our emergency department and diagnosed with primary hyperthyroidism and placed on methimazole as well as propranolol.  He has followed up with his primary care physician who took him off the medication several days ago with plans for a thyroid test this coming Thursday.  He feels that ever since stopping the medications he has been more anxious and tremulous.  His symptoms had gradual onset and have been constant.  Seem to be worse with exertion and improved with rest.  His primary concern is sinus congestion he is worried he might have sinusitis.  Past Medical History:  Diagnosis Date  . Pre-diabetes     There are no active problems to display for this patient.   History reviewed. No pertinent surgical history.  Prior to Admission medications   Medication Sig Start Date End Date Taking? Authorizing Provider  ibuprofen (ADVIL,MOTRIN) 600 MG tablet Take 1 tablet (600 mg total) by mouth every 8 (eight) hours as needed. 12/24/17   Merrily Brittleifenbark, Lamira Borin, MD  ipratropium (ATROVENT) 0.06 % nasal spray Place 2 sprays into the nose 3 (three) times daily. 12/24/17 12/24/18  Merrily Brittleifenbark, Keaunna Skipper, MD  methimazole (TAPAZOLE) 10 MG tablet Take 1 tablet (10 mg total) by mouth daily. 11/25/17   Sharman CheekStafford, Phillip, MD  propranolol (INDERAL) 20 MG tablet Take 1 tablet (20 mg total) by mouth 2 (two) times daily. 11/25/17   Sharman CheekStafford, Phillip, MD    Allergies Patient has no known allergies.  No family history on file.  Social History Social History    Tobacco Use  . Smoking status: Never Smoker  . Smokeless tobacco: Never Used  Substance Use Topics  . Alcohol use: No    Frequency: Never  . Drug use: No    Review of Systems Constitutional: No fever/chills Eyes: No visual changes. ENT: No sore throat. Cardiovascular: Denies chest pain. Respiratory: Denies shortness of breath. Gastrointestinal: No abdominal pain.  No nausea, no vomiting.  No diarrhea.  No constipation. Genitourinary: Negative for dysuria. Musculoskeletal: Negative for back pain. Skin: Negative for rash. Neurological: Negative for headaches, focal weakness or numbness.   ____________________________________________   PHYSICAL EXAM:  VITAL SIGNS: ED Triage Vitals  Enc Vitals Group     BP 12/24/17 1207 (!) 160/88     Pulse Rate 12/24/17 1207 (!) 111     Resp 12/24/17 1207 19     Temp 12/24/17 1207 98.2 F (36.8 C)     Temp Source 12/24/17 1207 Oral     SpO2 12/24/17 1207 98 %     Weight 12/24/17 1208 170 lb (77.1 kg)     Height 12/24/17 1208 5\' 11"  (1.803 m)     Head Circumference --      Peak Flow --      Pain Score --      Pain Loc --      Pain Edu? --      Excl. in GC? --     Constitutional: Alert and oriented x4 somewhat anxious appearing nontoxic no diaphoresis speaks full clear sentences Eyes: PERRL EOMI. Head:  Atraumatic.  Mild sinus tenderness with no discharge Nose: No congestion/rhinnorhea. Mouth/Throat: No trismus nontender thyroid Neck: No stridor.   Cardiovascular: Normal rate, regular rhythm. Grossly normal heart sounds.  Good peripheral circulation. Respiratory: Normal respiratory effort.  No retractions. Lungs CTAB and moving good air Gastrointestinal: Soft nontender Musculoskeletal: Faint tremors Neurologic:  Normal speech and language. No gross focal neurologic deficits are appreciated. Skin:  Skin is warm, dry and intact. No rash noted. Psychiatric: Somewhat anxious  appearing    ____________________________________________   DIFFERENTIAL includes but not limited to  Thyrotoxicosis, sinusitis, pneumonia, and cocaine abuse ____________________________________________   LABS (all labs ordered are listed, but only abnormal results are displayed)  Labs Reviewed  BASIC METABOLIC PANEL - Abnormal; Notable for the following components:      Result Value   CO2 20 (*)    Glucose, Bld 140 (*)    All other components within normal limits  CBC - Abnormal; Notable for the following components:   RDW 15.9 (*)    All other components within normal limits  TSH - Abnormal; Notable for the following components:   TSH <0.010 (*)    All other components within normal limits  T4, FREE - Abnormal; Notable for the following components:   Free T4 2.75 (*)    All other components within normal limits  TROPONIN I    Lab work reviewed by me consistent with persistent thyrotoxicosis __________________________________________  EKG  ED ECG REPORT I, Merrily Brittle, the attending physician, personally viewed and interpreted this ECG.  Date: 12/24/2017 EKG Time:  Rate: 111 Rhythm: Sinus tachycardia QRS Axis: normal Intervals: normal ST/T Wave abnormalities: normal Narrative Interpretation: no evidence of acute ischemia  ____________________________________________  RADIOLOGY  Chest x-ray reviewed by me with no acute disease ____________________________________________   PROCEDURES  Procedure(s) performed: no  Procedures  Critical Care performed: no  Observation: no ____________________________________________   INITIAL IMPRESSION / ASSESSMENT AND PLAN / ED COURSE  Pertinent labs & imaging results that were available during my care of the patient were reviewed by me and considered in my medical decision making (see chart for details).  Patient arrives anxious appearing somewhat tremulous.  His labs today confirm persistent thyrotoxicosis.   We had a lengthy discussion regarding his symptoms and he understands that should improve once he is able to resume his medications in 3 days.  I will prescribe him Atrovent nasal spray to help with his congestion.  Discharged home in improved condition and he verbalizes understanding and agreement with the plan.      ____________________________________________   FINAL CLINICAL IMPRESSION(S) / ED DIAGNOSES  Final diagnoses:  Thyrotoxicosis without thyroid storm, unspecified thyrotoxicosis type  Sinus congestion      NEW MEDICATIONS STARTED DURING THIS VISIT:  Discharge Medication List as of 12/24/2017  1:08 PM    START taking these medications   Details  ibuprofen (ADVIL,MOTRIN) 600 MG tablet Take 1 tablet (600 mg total) by mouth every 8 (eight) hours as needed., Starting Mon 12/24/2017, Print    ipratropium (ATROVENT) 0.06 % nasal spray Place 2 sprays into the nose 3 (three) times daily., Starting Mon 12/24/2017, Until Tue 12/24/2018, Print         Note:  This document was prepared using Dragon voice recognition software and may include unintentional dictation errors.     Merrily Brittle, MD 12/24/17 [phone]

## 2017-12-27 ENCOUNTER — Encounter
Admission: RE | Admit: 2017-12-27 | Discharge: 2017-12-27 | Disposition: A | Discharge status: Home / Self Care | Payer: BLUE CROSS/BLUE SHIELD | Source: Ambulatory Visit | Attending: Family Medicine | Admitting: Family Medicine

## 2017-12-27 DIAGNOSIS — E0590 Thyrotoxicosis, unspecified without thyrotoxic crisis or storm: Secondary | ICD-10-CM | POA: Insufficient documentation

## 2017-12-27 MED ORDER — SODIUM IODIDE I-123 7.4 MBQ CAPS
149.2000 | ORAL_CAPSULE | Freq: Once | ORAL | Status: AC
  Administered 2017-12-27: 149.2 via ORAL

## 2017-12-28 ENCOUNTER — Encounter: Admission: RE | Admit: 2017-12-28 | Payer: BLUE CROSS/BLUE SHIELD | Source: Ambulatory Visit

## 2017-12-28 IMAGING — NM NM THYROID IMAGING W/ UPTAKE MULTI (4&24 HR)
1 series · 4 of 4 positions shown · non-contrast
Comparison: None.

CLINICAL DATA: Hyperthyroidism.  51-year-old male.

EXAM:
THYROID SCAN AND UPTAKE - 4 AND 24 HOURS
TECHNIQUE: Following oral administration of 0-CXS capsule, anterior planar
imaging was acquired at 24 hours. Thyroid uptake was calculated with
a thyroid probe at 4-6 hours and 24 hours.
RADIOPHARMACEUTICALS:  149.2 uCi 0-CXS sodium iodide p.o.

[Series 1000: (id) thyroid scan · 2.40mm/px · 4 of 4 slices shown]
[im 1/4]
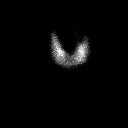
[im 2/4]
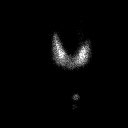
[im 3/4]
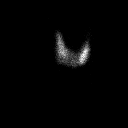
[im 4/4]
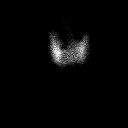

[4 of 4 positions shown; findings below may reference images not displayed]

FINDINGS: Uniform uptake within the thyroid gland which is mildly enlarged.
Pyramidal lobe identified. No nodularity..

6 hour 0-CXS uptake = 63% (normal 5-20%)

24 hour 0-CXS uptake = 73.8% (normal 10-30%)
IMPRESSION: Thyroid imaging and I 123 uptake consistent with Graves disease.

## 2018-03-12 ENCOUNTER — Emergency Department
Admission: EM | Admit: 2018-03-12 | Discharge: 2018-03-12 | Disposition: A | Discharge status: Home / Self Care | Payer: BLUE CROSS/BLUE SHIELD | Attending: Emergency Medicine | Admitting: Emergency Medicine

## 2018-03-12 ENCOUNTER — Encounter: Payer: Self-pay | Admitting: Emergency Medicine

## 2018-03-12 DIAGNOSIS — R002 Palpitations: Secondary | ICD-10-CM | POA: Insufficient documentation

## 2018-03-12 DIAGNOSIS — Z5321 Procedure and treatment not carried out due to patient leaving prior to being seen by health care provider: Secondary | ICD-10-CM | POA: Insufficient documentation

## 2018-03-12 DIAGNOSIS — R42 Dizziness and giddiness: Secondary | ICD-10-CM | POA: Insufficient documentation

## 2018-03-12 NOTE — ED Triage Notes (Signed)
Patient presents to ED via POV. Patient states "I can't describe it. I feel weird. I feel like I am going to die". Patient denies CP, dizziness or palpitations. Denies N/V/D or abdominal pain. Denies HA. Patient reports this started today while he was driving. Patient reports "I think its anxiety, sadness or depression". Denies HI or SI. "Maybe its my thyroid".

## 2018-03-12 NOTE — ED Notes (Signed)
Via [facility] interpretter helen. Patient states he is leaving because he is feeling better.  Says he has had this before and it is caused by thyroid.  Says he has appt with [facility] tomorrow am.  I encouraged him to stay, but he declines.  He agrees to return in the mean time if he starts feeling bad or changes his mind.

## 2018-03-12 NOTE — ED Notes (Signed)
Spoke to [place] MD in regards to patient presentation. No orders at this time.

## 2018-03-20 ENCOUNTER — Emergency Department: Payer: BLUE CROSS/BLUE SHIELD

## 2018-03-20 ENCOUNTER — Encounter: Payer: Self-pay | Admitting: Emergency Medicine

## 2018-03-20 ENCOUNTER — Emergency Department
Admission: EM | Admit: 2018-03-20 | Discharge: 2018-03-20 | Disposition: A | Discharge status: Home / Self Care | Payer: BLUE CROSS/BLUE SHIELD | Attending: Emergency Medicine | Admitting: Emergency Medicine

## 2018-03-20 ENCOUNTER — Emergency Department
Admission: EM | Admit: 2018-03-20 | Discharge: 2018-03-21 | Disposition: A | Discharge status: Home / Self Care | Payer: BLUE CROSS/BLUE SHIELD | Source: Home / Self Care | Attending: Emergency Medicine | Admitting: Emergency Medicine

## 2018-03-20 ENCOUNTER — Other Ambulatory Visit: Payer: Self-pay

## 2018-03-20 DIAGNOSIS — E039 Hypothyroidism, unspecified: Secondary | ICD-10-CM | POA: Diagnosis not present

## 2018-03-20 DIAGNOSIS — R5383 Other fatigue: Secondary | ICD-10-CM | POA: Diagnosis present

## 2018-03-20 DIAGNOSIS — F410 Panic disorder [episodic paroxysmal anxiety] without agoraphobia: Secondary | ICD-10-CM

## 2018-03-20 DIAGNOSIS — Z79899 Other long term (current) drug therapy: Secondary | ICD-10-CM | POA: Insufficient documentation

## 2018-03-20 DIAGNOSIS — F419 Anxiety disorder, unspecified: Secondary | ICD-10-CM | POA: Insufficient documentation

## 2018-03-20 DIAGNOSIS — R7303 Prediabetes: Secondary | ICD-10-CM

## 2018-03-20 LAB — BASIC METABOLIC PANEL
Anion gap: 8 (ref 5–15)
BUN: 17 mg/dL (ref 6–20)
CHLORIDE: 100 mmol/L — AB (ref 101–111)
CO2: 24 mmol/L (ref 22–32)
CREATININE: 1.21 mg/dL (ref 0.61–1.24)
Calcium: 9 mg/dL (ref 8.9–10.3)
Glucose, Bld: 125 mg/dL — ABNORMAL HIGH (ref 65–99)
POTASSIUM: 3.4 mmol/L — AB (ref 3.5–5.1)
SODIUM: 132 mmol/L — AB (ref 135–145)

## 2018-03-20 LAB — TSH: TSH: 107.471 u[IU]/mL — AB (ref 0.350–4.500)

## 2018-03-20 LAB — TROPONIN I: Troponin I: <0.03 ng/mL (ref <0.03)

## 2018-03-20 LAB — CBC
HEMATOCRIT: 46.2 % (ref 40.0–52.0)
Hemoglobin: 15.7 g/dL (ref 13.0–18.0)
MCH: 30.4 pg (ref 26.0–34.0)
MCHC: 34 g/dL (ref 32.0–36.0)
MCV: 89.4 fL (ref 80.0–100.0)
PLATELETS: 228 10*3/uL (ref 150–440)
RBC: 5.17 MIL/uL (ref 4.40–5.90)
RDW: 16.8 % — AB (ref 11.5–14.5)
WBC: 5 10*3/uL (ref 3.8–10.6)

## 2018-03-20 LAB — T4, FREE

## 2018-03-20 IMAGING — CR DG CHEST 2V
1 series · 2 of 2 positions shown · non-contrast
Comparison: Chest x-ray of December 24, 2017.

CLINICAL DATA: Mid chest pain and dyspnea since last night.
Nonsmoker.

EXAM:
CHEST - 2 VIEW

[Series 1: dg chest 2 view · 0.14mm/px · 2 of 2 slices shown]
[im 1/2]
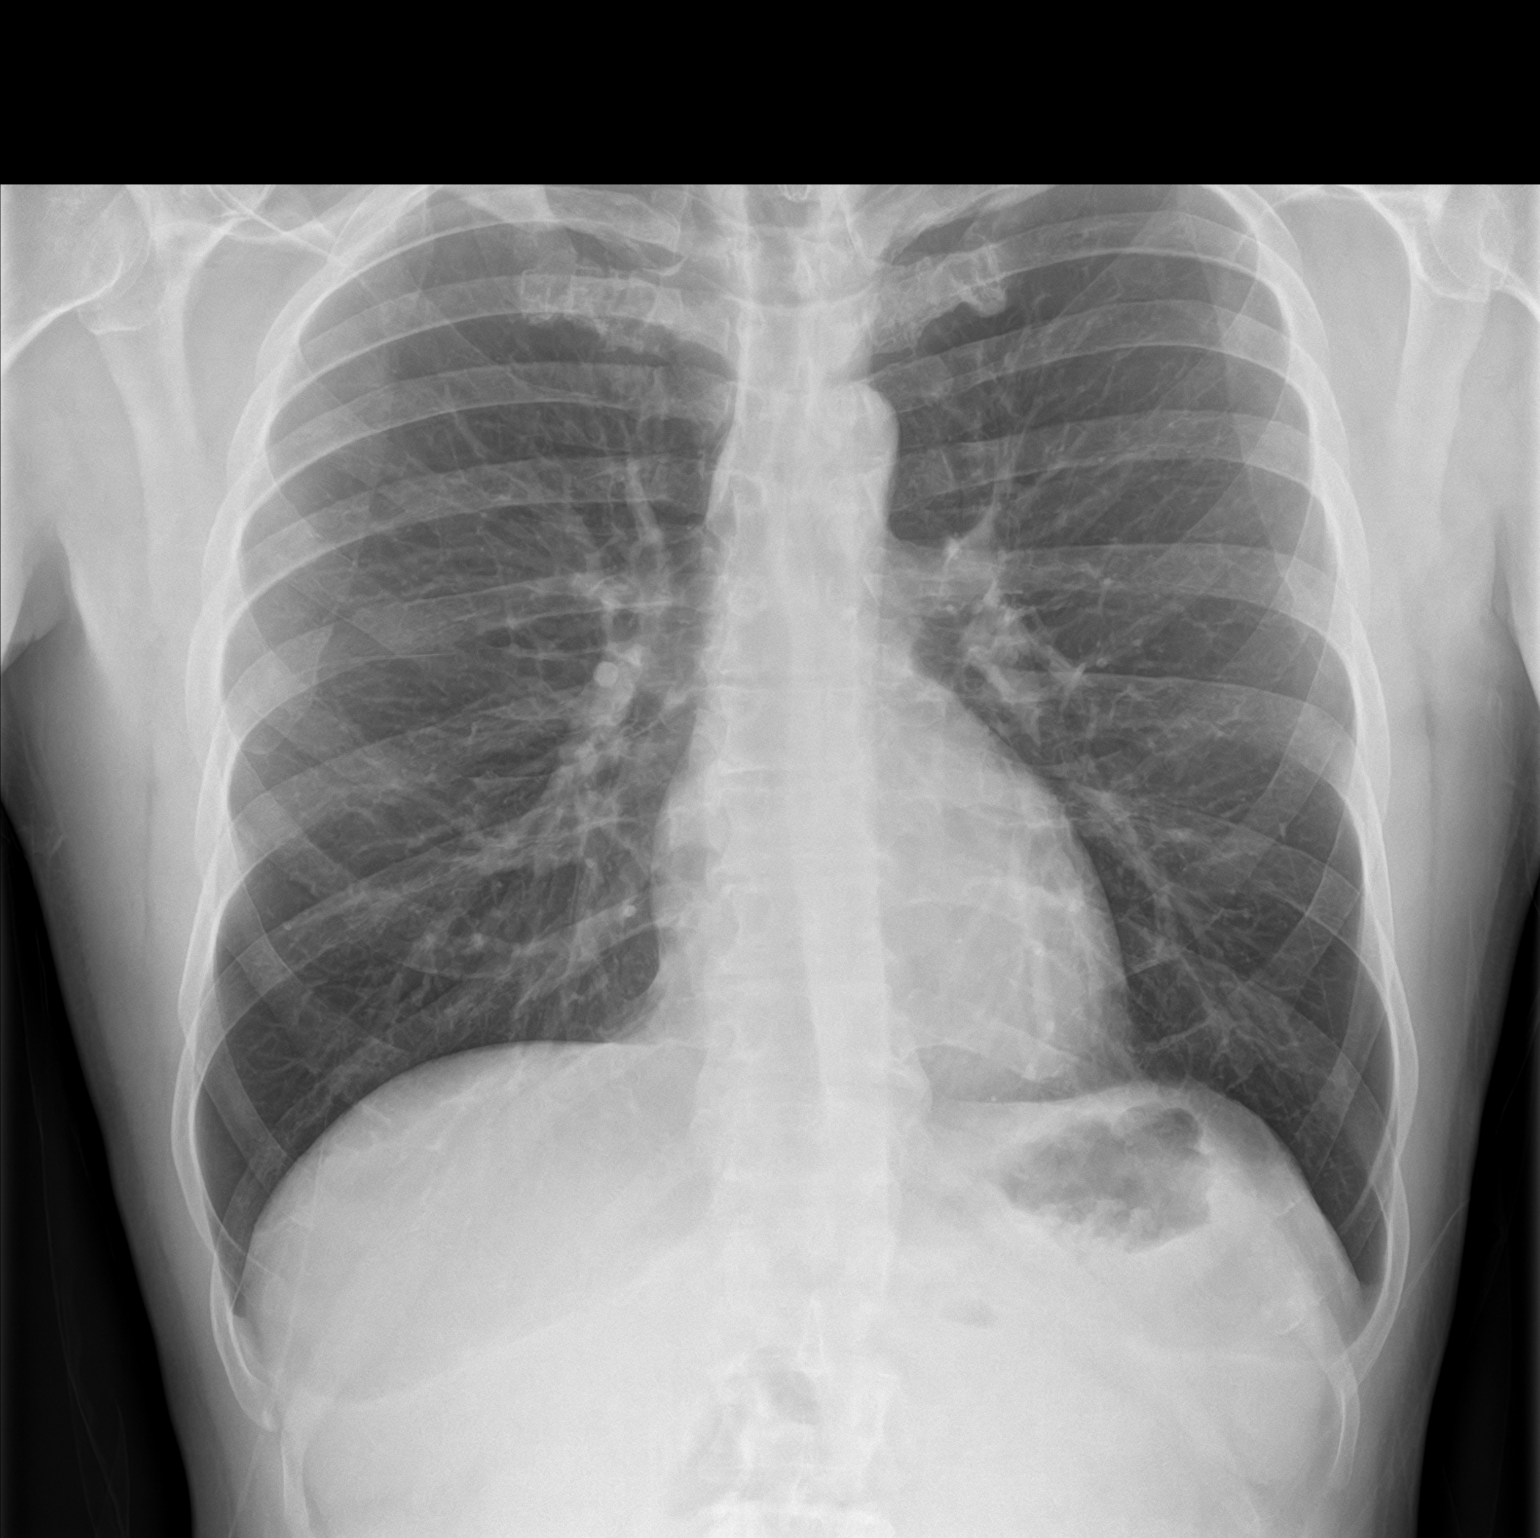
[im 2/2]
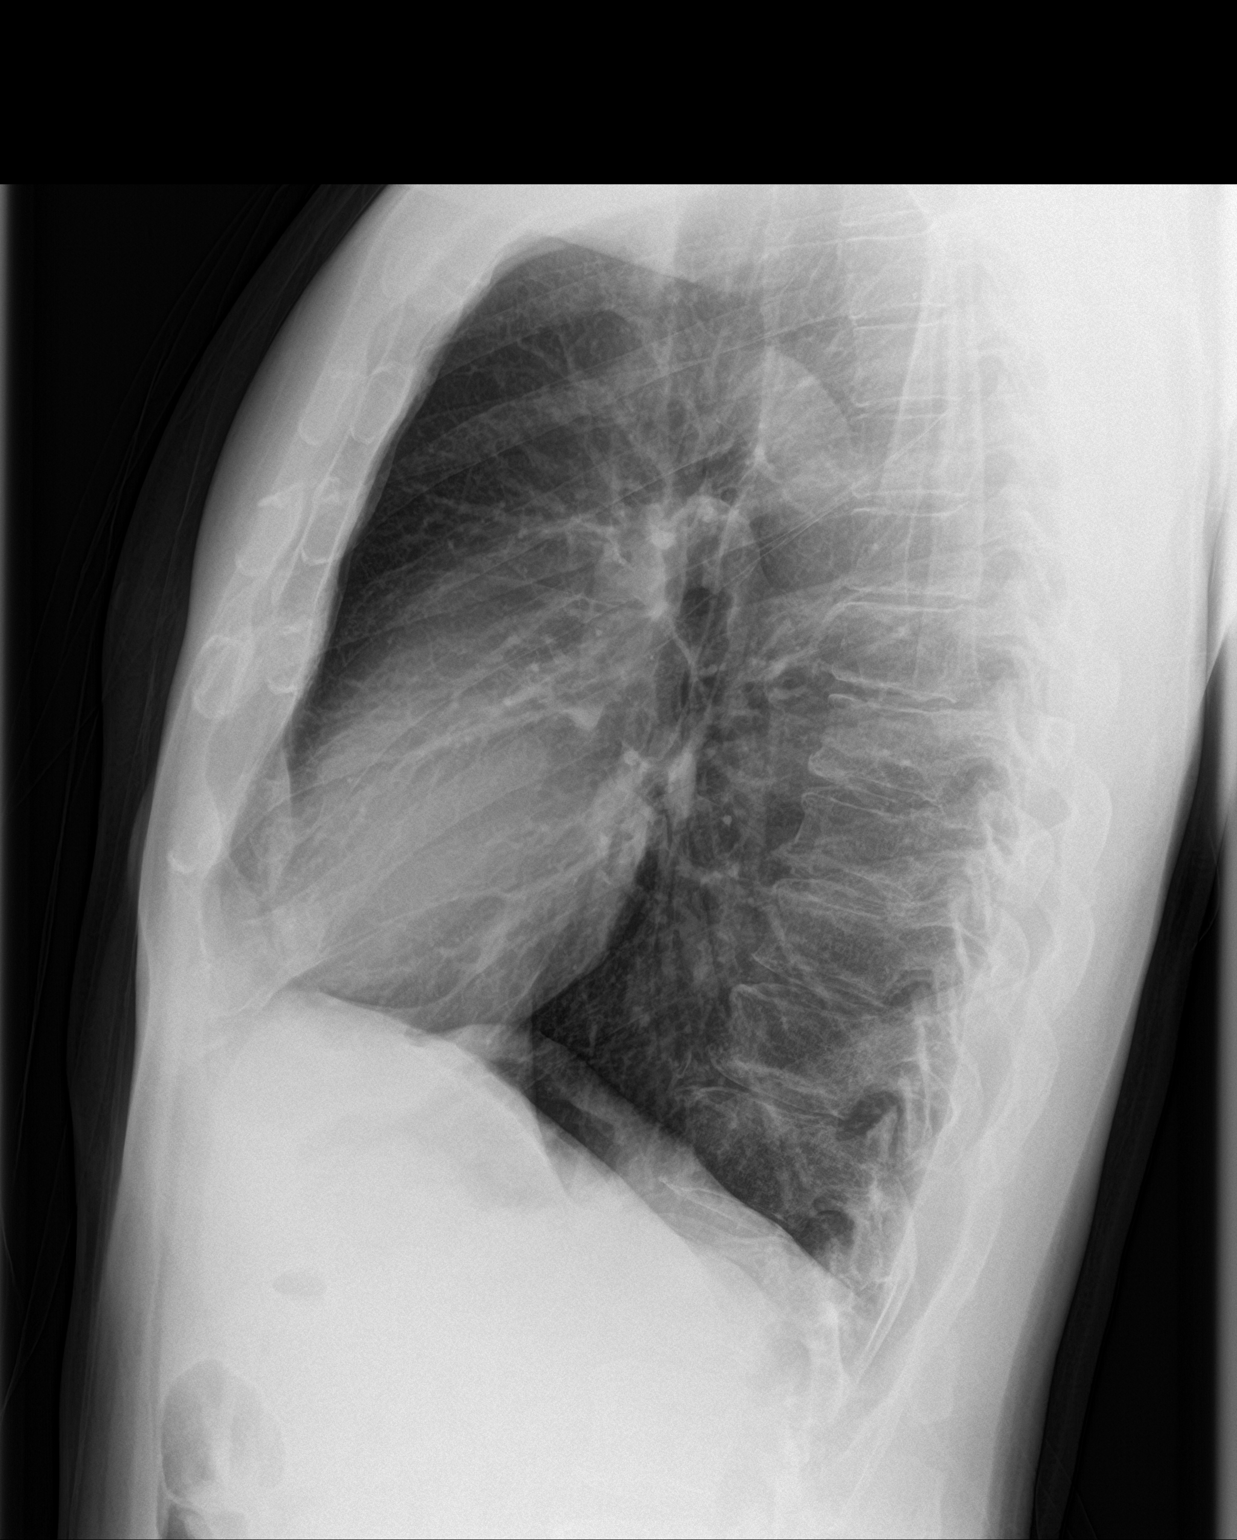

[2 of 2 positions shown; findings below may reference images not displayed]

FINDINGS: The lungs are mildly hyperinflated with hemidiaphragm flattening.
There is no focal infiltrate. There is no pleural effusion. The
heart and pulmonary vascularity are normal. The mediastinum is
normal in width. The trachea is midline. The bony thorax exhibits no
acute abnormality.
IMPRESSION: Mild hyperinflation may be voluntary or may reflect underlying air
trapping secondary to reactive airway disease or acute bronchitis.
No acute pneumonia nor CHF.

## 2018-03-20 MED ORDER — LEVOTHYROXINE SODIUM 50 MCG PO TABS
50.0000 ug | ORAL_TABLET | Freq: Every day | ORAL | Status: DC
  Administered 2018-03-20: 50 ug via ORAL

## 2018-03-20 MED ORDER — LEVOTHYROXINE SODIUM 50 MCG PO TABS
ORAL_TABLET | ORAL | Status: AC
  Filled 2018-03-20: qty 1

## 2018-03-20 MED ORDER — LEVOTHYROXINE SODIUM 125 MCG PO TABS: 125.0000 ug | ORAL_TABLET | Freq: Every day | ORAL | 0 refills | Status: AC

## 2018-03-20 MED ORDER — LEVOTHYROXINE SODIUM 50 MCG PO TABS
ORAL_TABLET | ORAL | Status: AC
  Administered 2018-03-20: 50 ug via ORAL
  Filled 2018-03-20: qty 1

## 2018-03-20 NOTE — ED Triage Notes (Addendum)
Patient ambulatory to triage with steady gait, without difficulty or distress noted; st seen earlier today due to concern for his thyroid and voices med change; c/o persistent anxiety; pt denies any pain or specific c/o; denies SI or HI

## 2018-03-20 NOTE — ED Provider Notes (Addendum)
[facility] Emergency Department Provider Note  ____________________________________________   I have reviewed the triage vital signs and the nursing notes. Where available I have reviewed prior notes and, if possible and indicated, outside hospital notes.    HISTORY  Chief Complaint Chest Pain    HPI Bradley Robinson is a 52 y.o. male who presents today very worried about his thyroid levels.  Patient has a history of hyper thyroid he is on methimazole and propanolol.  He recently however had some blood work that caused him to go down on his from methimazole to 2 times a day instead of 5 times a day and they stopped his propranolol entirely.  He states he feels less energy and he feels that his thyroid is now too low.  He did not take his methimazole this morning.  He denies any nausea vomiting diarrhea.  He states he does have a history depression he has no SI or HI.  He really wants to have his thyroid elevated so he can feel better.  He is very vague generalized feelings of malaise.  He has been followed closely by his primary care doctor for this, and been to the emergency room for it as well, he does have an outpatient appointment apparently with rheumatology, but has not yet been able to see them. He says only about chest pain on the triage but he denied chest pain to me he states sometimes he gets very anxious he does have a history of panic attacks.  He states when he has panic attacks sometimes he feels generalized discomfort but not specific chest pain.  History as per patient and interpreter.  Past Medical History:  Diagnosis Date  . Pre-diabetes     There are no active problems to display for this patient.   History reviewed. No pertinent surgical history.  Prior to Admission medications   Medication Sig Start Date End Date Taking? Authorizing Provider  ibuprofen (ADVIL,MOTRIN) 600 MG tablet Take 1 tablet (600 mg total) by mouth every 8 (eight)  hours as needed. 12/24/17   Merrily Brittle, MD  ipratropium (ATROVENT) 0.06 % nasal spray Place 2 sprays into the nose 3 (three) times daily. 12/24/17 12/24/18  Merrily Brittle, MD  methimazole (TAPAZOLE) 10 MG tablet Take 1 tablet (10 mg total) by mouth daily. 11/25/17   Sharman Cheek, MD  propranolol (INDERAL) 20 MG tablet Take 1 tablet (20 mg total) by mouth 2 (two) times daily. 11/25/17   Sharman Cheek, MD    Allergies Patient has no known allergies.  No family history on file.  Social History Social History   Tobacco Use  . Smoking status: Never Smoker  . Smokeless tobacco: Never Used  Substance Use Topics  . Alcohol use: No    Frequency: Never  . Drug use: No    Review of Systems Constitutional: No fever/chills Eyes: No visual changes. ENT: No sore throat. No stiff neck no neck pain Cardiovascular: Denies chest pain. Respiratory: Denies shortness of breath. Gastrointestinal:   no vomiting.  No diarrhea.  No constipation. Genitourinary: Negative for dysuria. Musculoskeletal: Negative lower extremity swelling Skin: Negative for rash. Neurological: Negative for severe headaches, focal weakness or numbness.   ____________________________________________   PHYSICAL EXAM:  VITAL SIGNS: ED Triage Vitals  Enc Vitals Group     BP 03/20/18 0920 (!) 148/83     Pulse Rate 03/20/18 0920 72     Resp 03/20/18 0920 20     Temp 03/20/18 0920 97.8  F (36.6 C)     Temp src --      SpO2 03/20/18 0920 100 %     Weight --      Height --      Head Circumference --      Peak Flow --      Pain Score 03/20/18 0925 6     Pain Loc --      Pain Edu? --      Excl. in GC? --     Constitutional: Alert and oriented. Well appearing and in no acute distress. Eyes: Conjunctivae are normal Head: Atraumatic HEENT: No congestion/rhinnorhea. Mucous membranes are moist.  Oropharynx non-erythematous Neck:   Nontender with no meningismus, no masses, no stridor Cardiovascular: Normal  rate, regular rhythm. Grossly normal heart sounds.  Good peripheral circulation. Respiratory: Normal respiratory effort.  No retractions. Lungs CTAB. Abdominal: Soft and nontender. No distention. No guarding no rebound Back:  There is no focal tenderness or step off.  there is no midline tenderness there are no lesions noted. there is no CVA tenderness Musculoskeletal: No lower extremity tenderness, no upper extremity tenderness. No joint effusions, no DVT signs strong distal pulses no edema Neurologic:  Normal speech and language. No gross focal neurologic deficits are appreciated.  Skin:  Skin is warm, dry and intact. No rash noted. Psychiatric: Mood and affect are anxious. Speech and behavior are normal.  ____________________________________________   LABS (all labs ordered are listed, but only abnormal results are displayed)  Labs Reviewed  BASIC METABOLIC PANEL - Abnormal; Notable for the following components:      Result Value   Sodium 132 (*)    Potassium 3.4 (*)    Chloride 100 (*)    Glucose, Bld 125 (*)    All other components within normal limits  CBC - Abnormal; Notable for the following components:   RDW 16.8 (*)    All other components within normal limits  TSH - Abnormal; Notable for the following components:   TSH 107.471 (*)    All other components within normal limits  TROPONIN I    Pertinent labs  results that were available during my care of the patient were reviewed by me and considered in my medical decision making (see chart for details). ____________________________________________  EKG  I personally interpreted any EKGs ordered by me or triage Sinus rhythm rate 72 bpm no acute ST elevation or depression, or lateral right bundle branch block ____________________________________________  RADIOLOGY  Pertinent labs & imaging results that were available during my care of the patient were reviewed by me and considered in my medical decision making (see  chart for details). If possible, patient and/or family made aware of any abnormal findings.  Dg Chest 2 View  Result Date: 03/20/2018 CLINICAL DATA:  Mid chest pain and dyspnea since last night. Nonsmoker. EXAM: CHEST - 2 VIEW COMPARISON:  Chest x-ray of December 24, 2017. FINDINGS: The lungs are mildly hyperinflated with hemidiaphragm flattening. There is no focal infiltrate. There is no pleural effusion. The heart and pulmonary vascularity are normal. The mediastinum is normal in width. The trachea is midline. The bony thorax exhibits no acute abnormality. IMPRESSION: Mild hyperinflation may be voluntary or may reflect underlying air trapping secondary to reactive airway disease or acute bronchitis. No acute pneumonia nor CHF. Electronically Signed   By: David  [place] M.D.   On: 03/20/2018 10:24   ____________________________________________    PROCEDURES  Procedure(s) performed: None  Procedures  Critical Care performed: None  ____________________________________________   INITIAL IMPRESSION / ASSESSMENT AND PLAN / ED COURSE  Pertinent labs & imaging results that were available during my care of the patient were reviewed by me and considered in my medical decision making (see chart for details).  Patient here because he wants help with his thyroid regulation which has been somewhat erratic recently.  His TSH today is elevated at 107, suggesting that he is actually at this time hypothyroid, although he has been hyperthyroid on prior checks.  Last TSH I can find was less than 0.10 on March 15, and then on the first his free T4 was less than 0.15.  He did stop taking his methimazole this morning out of concern that he feels hypothyroid.  Discussed with rheumatology hopefully we can get him in or at least get guidance on how we can be of help to the patient.  He has had some poorly described generalized body disorder and some depression symptoms but he denies SI HI does not want to talk  to his psychiatrist does not meet criteria for acute inpatient psychiatric care.  He states that he is very anxious about his thyroid and this is why his chest is sometimes hurt.  We did do cardiac work-up i is no evidence of acute coronary syndrome and I have low suspicion for ACS PE dissection my colitis on account of his peritonitis pneumonia pneumothorax or other intrathoracic pathology of any significance.  Reviewed x-ray.  We are therefore calling rheumatology to see if they can either get the patient in sooner given some advice on what they would like [place] to do for him.  ----------------------------------------- 3:25 PM on 03/20/2018 -----------------------------------------  Discussed with Dr. Tedd Sias, who will follow closely with the patient and already has outpatient appointments.  Patient TSH and free T4 will be followed up by her and PCP.  Patient complaint is now hypothyroid, doctors Solum feels that just holding his methimazole should be sufficient to correct this but she does state if the patient was feeling very uncomfortable he can have Synthroid, 0.125 x 2 days to see if that helps relieve some of his anxiety about his thyroid symptoms.  We have conveyed this information to him and he will follow closely with her and PCP.    ____________________________________________   FINAL CLINICAL IMPRESSION(S) / ED DIAGNOSES  Final diagnoses:  None      This chart was dictated using voice recognition software.  Despite best efforts to proofread,  errors can occur which can change meaning.      Jeanmarie Plant, MD 03/20/18 1419    Jeanmarie Plant, MD 03/20/18 [phone]

## 2018-03-20 NOTE — ED Triage Notes (Signed)
Pt to ED via POV c/ chest pain since last night. Pt states that he is feeling short of breath and having nausea. Pt states that he is also having some diarrhea. Pt states that the pain is located in his left chest, pain comes and goes. Pt is in NAD at this time.

## 2018-03-20 NOTE — ED Notes (Addendum)
Pt to front desk asking to speak with spanish interpreter, interpreter paged

## 2018-03-20 NOTE — ED Notes (Signed)
Red top and green tubes sent to lab for add on specimens

## 2018-03-20 NOTE — ED Notes (Signed)
Pt given snacks at this time

## 2018-03-20 NOTE — Discharge Instructions (Signed)
Regrese a la sala de emergencias para cualquier sntoma nuevo o preocupante, como dolor en el pecho, dificultad para respirar, nuseas o vmitos. [place] el medicamento que le hemos proporcionado durante los prximos 2 Garrett, lo que le ayudar a Actor. No tome el metimazol hasta que hable con su mdico. Si tiene [place] idea de hacerse dao a usted mismo oa alguien ms, regrese a la sala de emergencias de inmediato.

## 2018-03-21 LAB — T3: T3, Total: <20 ng/dL — ABNORMAL LOW (ref 71–180)

## 2018-03-21 MED ORDER — LORAZEPAM 1 MG PO TABS: 1.0000 mg | ORAL_TABLET | Freq: Three times a day (TID) | ORAL | 0 refills | Status: AC | PRN

## 2018-03-21 MED ORDER — LORAZEPAM 1 MG PO TABS
1.0000 mg | ORAL_TABLET | Freq: Once | ORAL | Status: AC
  Administered 2018-03-21: 1 mg via ORAL
  Filled 2018-03-21: qty 1

## 2018-03-21 NOTE — Discharge Instructions (Signed)
Please take your Ativan only as needed for severe symptoms and keep your follow-up with your primary care physician regarding modifying your Synthroid this coming week as scheduled.  Return to the emergency department sooner for any concerns.  It was a pleasure to take care of you today, and thank you for coming to our emergency department.  If you have any questions or concerns before leaving please ask the nurse to grab me and I'm more than happy to go through your aftercare instructions again.  If you were prescribed any opioid pain medication today such as Norco, Vicodin, Percocet, morphine, hydrocodone, or oxycodone please make sure you do not drive when you are taking this medication as it can alter your ability to drive safely.  If you have any concerns once you are home that you are not improving or are in fact getting worse before you can make it to your follow-up appointment, please do not hesitate to call [phone] and come back for further evaluation.  Merrily Brittle, MD  Results for orders placed or performed during the hospital encounter of 03/20/18  Basic metabolic panel  Result Value Ref Range   Sodium 132 (L) 135 - 145 mmol/L   Potassium 3.4 (L) 3.5 - 5.1 mmol/L   Chloride 100 (L) 101 - 111 mmol/L   CO2 24 22 - 32 mmol/L   Glucose, Bld 125 (H) 65 - 99 mg/dL   BUN 17 6 - 20 mg/dL   Creatinine, Ser 1.30 0.61 - 1.24 mg/dL   Calcium 9.0 8.9 - 86.5 mg/dL   GFR calc non Af Amer >60 >60 mL/min   GFR calc Af Amer >60 >60 mL/min   Anion gap 8 5 - 15  CBC  Result Value Ref Range   WBC 5.0 3.8 - 10.6 K/uL   RBC 5.17 4.40 - 5.90 MIL/uL   Hemoglobin 15.7 13.0 - 18.0 g/dL   HCT 78.4 69.6 - 29.5 %   MCV 89.4 80.0 - 100.0 fL   MCH 30.4 26.0 - 34.0 pg   MCHC 34.0 32.0 - 36.0 g/dL   RDW 28.4 (H) 13.2 - 44.0 %   Platelets 228 150 - 440 K/uL  Troponin I  Result Value Ref Range   Troponin I <0.03 <0.03 ng/mL  TSH  Result Value Ref Range   TSH 107.471 (H) 0.350 - 4.500 uIU/mL  T4, free    Result Value Ref Range   Free T4 <0.25 (L) 0.82 - 1.77 ng/dL   Dg Chest 2 View  Result Date: 03/20/2018 CLINICAL DATA:  Mid chest pain and dyspnea since last night. Nonsmoker. EXAM: CHEST - 2 VIEW COMPARISON:  Chest x-ray of December 24, 2017. FINDINGS: The lungs are mildly hyperinflated with hemidiaphragm flattening. There is no focal infiltrate. There is no pleural effusion. The heart and pulmonary vascularity are normal. The mediastinum is normal in width. The trachea is midline. The bony thorax exhibits no acute abnormality. IMPRESSION: Mild hyperinflation may be voluntary or may reflect underlying air trapping secondary to reactive airway disease or acute bronchitis. No acute pneumonia nor CHF. Electronically Signed   By: David  [place] M.D.   On: 03/20/2018 10:24

## 2018-03-21 NOTE — ED Provider Notes (Signed)
[facility] Emergency Department Provider Note  ____________________________________________   None    (approximate)  I have reviewed the triage vital signs and the nursing notes.   HISTORY  Chief Complaint Anxiety   HPI Bradley Robinson is a 52 y.o. male who self presents to the emergency department with anxiety.  His anxiety has been long-standing but is been acutely worsened for the past several days.  He was seen in our emergency department earlier today and was noted to have a very high TSH and an unmeasurable free T4.  Was prescribed 2 days worth of Synthroid after consultation with endocrinology.  The patient take half a tablet at home however his anxiety and tremors persisted so family brought him back for further evaluation.  He also began taking lexapro today.   Past Medical History:  Diagnosis Date  . Pre-diabetes     There are no active problems to display for this patient.   History reviewed. No pertinent surgical history.  Prior to Admission medications   Medication Sig Start Date End Date Taking? Authorizing Provider  ibuprofen (ADVIL,MOTRIN) 600 MG tablet Take 1 tablet (600 mg total) by mouth every 8 (eight) hours as needed. 12/24/17   Merrily Brittle, MD  ipratropium (ATROVENT) 0.06 % nasal spray Place 2 sprays into the nose 3 (three) times daily. 12/24/17 12/24/18  Merrily Brittle, MD  levothyroxine (SYNTHROID) 125 MCG tablet Take 1 tablet (125 mcg total) by mouth daily for 2 days. 03/20/18 03/22/18  Jeanmarie Plant, MD  LORazepam (ATIVAN) 1 MG tablet Take 1 tablet (1 mg total) by mouth every 8 (eight) hours as needed for anxiety. 03/21/18 03/21/19  Merrily Brittle, MD  methimazole (TAPAZOLE) 10 MG tablet Take 1 tablet (10 mg total) by mouth daily. 11/25/17   Sharman Cheek, MD  propranolol (INDERAL) 20 MG tablet Take 1 tablet (20 mg total) by mouth 2 (two) times daily. 11/25/17   Sharman Cheek, MD    Allergies Patient has no  known allergies.  No family history on file.  Social History Social History   Tobacco Use  . Smoking status: Never Smoker  . Smokeless tobacco: Never Used  Substance Use Topics  . Alcohol use: No    Frequency: Never  . Drug use: No    Review of Systems Constitutional: No fever/chills ENT: No sore throat. Cardiovascular: Denies chest pain. Respiratory: Denies shortness of breath. Gastrointestinal: No abdominal pain.  Positive for nausea, no vomiting.  No diarrhea.  No constipation. Musculoskeletal: Negative for back pain. Neurological: Negative for headaches   ____________________________________________   PHYSICAL EXAM:  VITAL SIGNS: ED Triage Vitals  Enc Vitals Group     BP 03/20/18 2217 (!) 149/83     Pulse Rate 03/20/18 2217 63     Resp 03/20/18 2217 18     Temp 03/20/18 2217 98 F (36.7 C)     Temp Source 03/20/18 2217 Oral     SpO2 03/20/18 2217 97 %     Weight 03/20/18 2216 175 lb (79.4 kg)     Height 03/20/18 2216  (1.778 m)     Head Circumference --      Peak Flow --      Pain Score 03/20/18 2216 0     Pain Loc --      Pain Edu? --      Excl. in GC? --     Constitutional: Alert and oriented x4 anxious appearing nontoxic no diaphoresis speaks full clear sentences  Head: Atraumatic. Nose: No congestion/rhinnorhea. Mouth/Throat: No trismus no tongue fasciculations Neck: No stridor.  Nonpalpable thyroid Cardiovascular: Regular rate and rhythm Respiratory: Normal respiratory effort.  No retractions. Gastrointestinal: Soft nontender Neurologic:  Normal speech and language. No gross focal neurologic deficits are appreciated.  Skin:  Skin is warm, dry and intact. No rash noted.    ____________________________________________  LABS (all labs ordered are listed, but only abnormal results are displayed)  Labs Reviewed - No data to  display   __________________________________________  EKG   ____________________________________________  RADIOLOGY   ____________________________________________   DIFFERENTIAL includes but not limited to  Thyrotoxicosis, generalized anxiety disorder, alcohol withdrawal   PROCEDURES  Procedure(s) performed: no  Procedures  Critical Care performed: no  Observation: no ____________________________________________   INITIAL IMPRESSION / ASSESSMENT AND PLAN / ED COURSE  Pertinent labs & imaging results that were available during my care of the patient were reviewed by me and considered in my medical decision making (see chart for details).  I had a lengthy discussion with the patient family regarding his diagnosis.  Given 1 g of oral Ativan with improvement of his symptoms.  I encouraged him to continue taking Lexapro and let them know that it can take up to 6 weeks for this medication to fully kick in.  He will be discharged home with follow-up as already scheduled.  Strict return precautions and given to the patient verbalized understanding agree with the plan.      ____________________________________________   FINAL CLINICAL IMPRESSION(S) / ED DIAGNOSES  Final diagnoses:  Anxiety  Anxiety attack  Hypothyroidism, unspecified type      NEW MEDICATIONS STARTED DURING THIS VISIT:  Discharge Medication List as of 03/21/2018 12:20 AM    START taking these medications   Details  LORazepam (ATIVAN) 1 MG tablet Take 1 tablet (1 mg total) by mouth every 8 (eight) hours as needed for anxiety., Starting Thu 03/21/2018, Until Fri 03/21/2019, Print         Note:  This document was prepared using Dragon voice recognition software and may include unintentional dictation errors.      Merrily Brittle, MD 03/24/18 [phone]

## 2018-03-21 NOTE — ED Notes (Signed)
Pt uprite on stretcher in exam room with no distress noted; pt seen earlier for concerns over his thyroid functioning; c/o persistent feelings of anxiety however; resp even/unlab, lungs clear, apical audible & regular; family at bedside

## 2019-08-18 ENCOUNTER — Other Ambulatory Visit: Payer: Self-pay

## 2019-08-18 ENCOUNTER — Ambulatory Visit: Payer: Self-pay

## 2019-08-18 VITALS — BP 110/68 | HR 67 | Resp 16 | Ht 70.0 in | Wt 171.0 lb

## 2019-08-18 DIAGNOSIS — Z008 Encounter for other general examination: Secondary | ICD-10-CM

## 2019-08-18 LAB — POCT LIPID PANEL
HDL: 53
LDL: 38
Non-HDL: 56
POC Glucose: 106 mg/dl — AB (ref 70–99)
TC/HDL: 2.1
TC: 110
TRG: 93

## 2019-08-18 NOTE — Progress Notes (Signed)
     Patient ID: Bradley Robinson, male    DOB: 03/22/1966, 53 y.o.   MRN: [id number]    Thank you!!  Shalla Bulluck RN  [place]  Occupational Health Nurse Specialist [facility] Direct Dial: [phone]  Cell:  [phone] Website: [email or web address] 

## 2019-11-20 ENCOUNTER — Other Ambulatory Visit: Payer: Self-pay

## 2019-11-20 ENCOUNTER — Ambulatory Visit: Payer: Self-pay

## 2019-11-20 DIAGNOSIS — Z0000 Encounter for general adult medical examination without abnormal findings: Secondary | ICD-10-CM

## 2019-11-20 LAB — GLUCOSE, POCT (MANUAL RESULT ENTRY): POC Glucose: 86 mg/dl (ref 70–99)

## 2019-11-20 NOTE — Progress Notes (Signed)
     Patient ID: Devin Ganaway [facility], male    DOB: 10-25-1966, 54 y.o.   MRN: [id number]    Thank you!!  Danise Edge RN  [facility]  Occupational Health Nurse Specialist [facility] Direct Dial: [phone]  Cell:  [phone] Website: [email or web address]
